# Patient Record
Sex: Female | Born: 1945 | Hispanic: Yes | Marital: Single | Smoking: Former smoker
Health system: Southern US, Community
[De-identification: ages and names within clinical notes are randomized; demographics above are authoritative.]

## PROBLEM LIST (undated history)

## (undated) DIAGNOSIS — E119 Type 2 diabetes mellitus without complications: Secondary | ICD-10-CM

## (undated) HISTORY — PX: BLADDER SURGERY: SHX569

---

## 2015-09-06 ENCOUNTER — Inpatient Hospital Stay (HOSPITAL_COMMUNITY)
Admission: EM | Admit: 2015-09-06 | Discharge: 2015-09-08 | DRG: 195 | Disposition: A | Discharge status: Home / Self Care | Payer: Medicaid Other | Attending: Student in an Organized Health Care Education/Training Program | Admitting: Student in an Organized Health Care Education/Training Program

## 2015-09-06 ENCOUNTER — Encounter (HOSPITAL_COMMUNITY): Payer: Self-pay | Admitting: Nurse Practitioner

## 2015-09-06 ENCOUNTER — Emergency Department (HOSPITAL_COMMUNITY): Payer: Medicaid Other

## 2015-09-06 DIAGNOSIS — R21 Rash and other nonspecific skin eruption: Secondary | ICD-10-CM | POA: Diagnosis present

## 2015-09-06 DIAGNOSIS — J13 Pneumonia due to Streptococcus pneumoniae: Principal | ICD-10-CM | POA: Diagnosis present

## 2015-09-06 DIAGNOSIS — J9601 Acute respiratory failure with hypoxia: Secondary | ICD-10-CM | POA: Insufficient documentation

## 2015-09-06 DIAGNOSIS — J189 Pneumonia, unspecified organism: Secondary | ICD-10-CM | POA: Diagnosis present

## 2015-09-06 DIAGNOSIS — E119 Type 2 diabetes mellitus without complications: Secondary | ICD-10-CM

## 2015-09-06 DIAGNOSIS — L299 Pruritus, unspecified: Secondary | ICD-10-CM | POA: Diagnosis present

## 2015-09-06 DIAGNOSIS — Z7984 Long term (current) use of oral hypoglycemic drugs: Secondary | ICD-10-CM

## 2015-09-06 DIAGNOSIS — R0902 Hypoxemia: Secondary | ICD-10-CM | POA: Diagnosis present

## 2015-09-06 DIAGNOSIS — Z87891 Personal history of nicotine dependence: Secondary | ICD-10-CM | POA: Diagnosis not present

## 2015-09-06 HISTORY — DX: Type 2 diabetes mellitus without complications: E11.9

## 2015-09-06 LAB — GLUCOSE, CAPILLARY
GLUCOSE-CAPILLARY: 44 mg/dL — AB (ref 65–99)
GLUCOSE-CAPILLARY: 30 mg/dL — AB (ref 65–99)
GLUCOSE-CAPILLARY: 101 mg/dL — AB (ref 65–99)
Glucose-Capillary: 112 mg/dL — ABNORMAL HIGH (ref 65–99)

## 2015-09-06 LAB — COMPREHENSIVE METABOLIC PANEL
ALBUMIN: 2.7 g/dL — AB (ref 3.5–5.0)
ALK PHOS: 90 U/L (ref 38–126)
ALT: 12 U/L — ABNORMAL LOW (ref 14–54)
ANION GAP: 12 (ref 5–15)
AST: 19 U/L (ref 15–41)
BILIRUBIN TOTAL: 0.5 mg/dL (ref 0.3–1.2)
BUN: 14 mg/dL (ref 6–20)
CALCIUM: 8.6 mg/dL — AB (ref 8.9–10.3)
CO2: 23 mmol/L (ref 22–32)
Chloride: 100 mmol/L — ABNORMAL LOW (ref 101–111)
Creatinine, Ser: 0.54 mg/dL (ref 0.44–1.00)
GFR calc non Af Amer: >60 mL/min (ref >60)
Glucose, Bld: 175 mg/dL — ABNORMAL HIGH (ref 65–99)
POTASSIUM: 4.3 mmol/L (ref 3.5–5.1)
SODIUM: 135 mmol/L (ref 135–145)
TOTAL PROTEIN: 6.5 g/dL (ref 6.5–8.1)

## 2015-09-06 LAB — CBC
HEMATOCRIT: 36.5 % (ref 36.0–46.0)
HEMOGLOBIN: 12 g/dL (ref 12.0–15.0)
MCH: 26.1 pg (ref 26.0–34.0)
MCHC: 32.9 g/dL (ref 30.0–36.0)
MCV: 79.3 fL (ref 78.0–100.0)
Platelets: 268 10*3/uL (ref 150–400)
RBC: 4.6 MIL/uL (ref 3.87–5.11)
RDW: 17.3 % — AB (ref 11.5–15.5)
WBC: 11.4 10*3/uL — ABNORMAL HIGH (ref 4.0–10.5)

## 2015-09-06 LAB — LIPASE, BLOOD: Lipase: 35 U/L (ref 11–51)

## 2015-09-06 LAB — INFLUENZA PANEL BY PCR (TYPE A & B)
H1N1FLUPCR: NOT DETECTED
INFLAPCR: NEGATIVE
INFLBPCR: NEGATIVE

## 2015-09-06 LAB — I-STAT TROPONIN, ED: TROPONIN I, POC: 0 ng/mL (ref 0.00–0.08)

## 2015-09-06 LAB — STREP PNEUMONIAE URINARY ANTIGEN: Strep Pneumo Urinary Antigen: POSITIVE — AB

## 2015-09-06 MED ORDER — DEXTROSE 5 % IV SOLN
500.0000 mg | INTRAVENOUS | Status: DC
  Administered 2015-09-07 – 2015-09-08 (×2): 500 mg via INTRAVENOUS
  Filled 2015-09-06 (×2): qty 500

## 2015-09-06 MED ORDER — INSULIN ASPART 100 UNIT/ML ~~LOC~~ SOLN: 0.0000 [IU] | Freq: Every day | SUBCUTANEOUS | Status: DC

## 2015-09-06 MED ORDER — ENOXAPARIN SODIUM 40 MG/0.4ML ~~LOC~~ SOLN
40.0000 mg | SUBCUTANEOUS | Status: DC
  Administered 2015-09-06 – 2015-09-07 (×2): 40 mg via SUBCUTANEOUS
  Filled 2015-09-06 (×2): qty 0.4

## 2015-09-06 MED ORDER — TRIAMCINOLONE ACETONIDE 0.1 % EX CREA
1.0000 "application " | TOPICAL_CREAM | Freq: Three times a day (TID) | CUTANEOUS | Status: DC
  Administered 2015-09-06 – 2015-09-08 (×6): 1 via TOPICAL
  Filled 2015-09-06 (×3): qty 15

## 2015-09-06 MED ORDER — SODIUM CHLORIDE 0.9 % IV SOLN
INTRAVENOUS | Status: AC
  Administered 2015-09-06: 23:00:00 via INTRAVENOUS

## 2015-09-06 MED ORDER — DEXTROSE 5 % IV SOLN
500.0000 mg | Freq: Once | INTRAVENOUS | Status: AC
  Administered 2015-09-06: 500 mg via INTRAVENOUS
  Filled 2015-09-06: qty 500

## 2015-09-06 MED ORDER — DEXTROSE 5 % IV SOLN
1.0000 g | Freq: Once | INTRAVENOUS | Status: AC
  Administered 2015-09-06: 1 g via INTRAVENOUS
  Filled 2015-09-06: qty 10

## 2015-09-06 MED ORDER — SODIUM CHLORIDE 0.9 % IV BOLUS (SEPSIS)
1000.0000 mL | Freq: Once | INTRAVENOUS | Status: AC
Start: 2015-09-06 — End: 2015-09-06
  Administered 2015-09-06: 1000 mL via INTRAVENOUS

## 2015-09-06 MED ORDER — INSULIN ASPART 100 UNIT/ML ~~LOC~~ SOLN
0.0000 [IU] | Freq: Three times a day (TID) | SUBCUTANEOUS | Status: DC
  Administered 2015-09-07: 1 [IU] via SUBCUTANEOUS
  Administered 2015-09-07 (×2): 3 [IU] via SUBCUTANEOUS
  Administered 2015-09-08: 5 [IU] via SUBCUTANEOUS
  Administered 2015-09-08: 2 [IU] via SUBCUTANEOUS

## 2015-09-06 MED ORDER — MORPHINE SULFATE (PF) 4 MG/ML IV SOLN
4.0000 mg | Freq: Once | INTRAVENOUS | Status: AC
  Administered 2015-09-06: 4 mg via INTRAVENOUS
  Filled 2015-09-06: qty 1

## 2015-09-06 MED ORDER — DICLOFENAC SODIUM 1 % TD GEL
2.0000 g | Freq: Four times a day (QID) | TRANSDERMAL | Status: DC
  Administered 2015-09-06 – 2015-09-08 (×8): 2 g via TOPICAL
  Filled 2015-09-06: qty 100

## 2015-09-06 MED ORDER — ACETAMINOPHEN 325 MG PO TABS: 650.0000 mg | ORAL_TABLET | Freq: Four times a day (QID) | ORAL | Status: DC | PRN

## 2015-09-06 NOTE — Progress Notes (Signed)
Ashlee Rivera [id number] Admission Data: 09/06/2015 4:19 PM Attending Provider: Tyson Aliasuncan Thomas Vincent, MD  PCP:No PCP Per Patient Consults/ Treatment Team:    Ashlee Rivera is a 70 y.o. female patient admitted from ED awake, alert  & orientated  X 3 (Spanish speaking),  No Order, VSS - Blood pressure 129/55, pulse 73, temperature 98.3 F (36.8 C), temperature source Oral, resp. rate 23, weight 65.006 kg (143 lb 5 oz), SpO2 94 %., O2    2  L nasal cannular, no c/o shortness of breath, no c/o chest pain, no distress noted. Tele #    IV site WDL:  [facility]   Allergies:  No Known Allergies   Past Medical History  Diagnosis Date  . Diabetes mellitus without complication (HCC)       Pt orientation to unit, room and routine. Information packet given to patient/family and safety video watched.  Admission INP armband ID verified with patient/family, and in place. SR up x 2, fall risk assessment complete with Patient and family verbalizing understanding of risks associated with falls. Pt verbalizes an understanding of how to use the call bell and to call for help before getting out of bed.  Skin, clean-dry- intact without evidence of bruising, or skin tears.   Rash noted to lower back side, and lower extremities. Open to air at this time. Rash seems to be circular and scabbed over. Family unable to comment at this time of reason for rash.      Will cont to monitor and assist as needed.  Kern ReapBrumagin, Tiler Brandis L, RN 09/06/2015 4:19 PM

## 2015-09-06 NOTE — H&P (Signed)
Date: 09/06/2015               Patient Name:  Ashlee Rivera MRN: [id number]  DOB: [birth year] Age / Sex: 70 y.o., female   PCP: No Pcp Per Patient         Medical Service: Internal Medicine Teaching Service         Attending Physician: Dr. Azalia BilisKevin Campos, MD    First Contact: Dr. Reubin MilanBilly Izael Rivera Pager: [phone]  Second Contact: Dr. Gara Kroneriana Rivera Pager: [phone]       After Hours (After 5p/  First Contact Pager: [phone]  weekends / holidays): Second Contact Pager: [phone]   Chief Complaint: Cough, fevers  History of Present Illness: Mrs. Trecia RogersSadana is a 70yo Spanish-speaking F with T2DM who presents with fevers and a productive cough that started 5 days ago. She is here from [place] family. She started having subjective fevers and chills, followed by a cough productive of clear-white sputum 5 days ago. Her cough worsened and then 3 days ago she started noticing sharp, right upper back/posterior shoulder pain that worsens with inspiration and coughing. She denies headache, sore throat, rhinorrhea, nausea, vomiting, diarrhea, abdominal pain, chest pain, palpitations, wheezing, weakness, myalgias, or urinary symptoms. She does endorse an intermittent pruritic, painful rash over her right upper back and midline lower back that has been going on for 3 months, that comes and goes spontaneously, and she was told this was related to her diabetes. No one else in the house has any respiratory symptoms or rashes.   Meds: Current Facility-Administered Medications  Medication Dose Route Frequency Provider Last Rate Last Dose  . azithromycin (ZITHROMAX) 500 mg in dextrose 5 % 250 mL IVPB  500 mg Intravenous Once Ashlee BilisKevin Campos, MD      . cefTRIAXone (ROCEPHIN) 1 g in dextrose 5 % 50 mL IVPB  1 g Intravenous Once Ashlee BilisKevin Campos, MD       No current outpatient prescriptions on file.    Allergies: Allergies as of 09/06/2015  . (No Known Allergies)   Past Medical History  Diagnosis Date  . Diabetes  mellitus without complication [facility](HCC)    Past Surgical History  Procedure Laterality Date  . Bladder surgery     History reviewed. No pertinent family history. Social History   Social History  . Marital Status: Single    Spouse Name: N/A  . Number of Children: N/A  . Years of Education: N/A   Occupational History  . Not on file.   Social History Main Topics  . Smoking status: Former Games developermoker  . Smokeless tobacco: Not on file  . Alcohol Use: No  . Drug Use: No  . Sexual Activity: Not on file   Other Topics Concern  . Not on file   Social History Narrative  . No narrative on file   Review of Systems: Pertinent items noted in HPI and remainder of comprehensive ROS otherwise negative.  Physical Exam: Blood pressure 133/60, pulse 77, temperature 98.3 F (36.8 C), temperature source Oral, resp. rate 24, weight 143 lb 5 oz (65.006 kg), SpO2 97 %.   Gen: Well-appearing, alert and oriented to person, place, and time HEENT: Oropharynx clear without erythema or exudate.  Neck: No cervical LAD, no thyromegaly or nodules, no JVD noted. CV: Normal rate, regular rhythm, no rubs, or gallops. A 2/6 systolic murmur heard best at the 2nd R ICS that is non-radiating. Pulmonary: Normal effort, coarse crackles heard bilaterally, worse at the bases. No wheezes auscultated. Abdominal: Soft,  non-tender, non-distended, without rebound, guarding, or masses Extremities: Distal pulses 2+ in upper and lower extremities bilaterally, no tenderness, erythema or edema.  Neuro: CN II-XII grossly intact, no focal weakness or sensory deficits noted Skin: No atypical appearing moles. Diffuse rash as pictured below.    Lab results: Basic Metabolic Panel:  Recent Labs  16/10/96 1257  NA 135  K 4.3  CL 100*  CO2 23  GLUCOSE 175*  BUN 14  CREATININE 0.54  CALCIUM 8.6*   Liver Function Tests:  Recent Labs  09/06/15 1257  AST 19  ALT 12*  ALKPHOS 90  BILITOT 0.5  PROT 6.5  ALBUMIN 2.7*     Recent Labs  09/06/15 1257  LIPASE 35   CBC:  Recent Labs  09/06/15 1258  WBC 11.4*  HGB 12.0  HCT 36.5  MCV 79.3  PLT 268   Imaging results:  Dg Chest 2 View  09/06/2015  CLINICAL DATA:  Cough and fever for 5 days. Back pain beginning 2 days ago. EXAM: CHEST  2 VIEW COMPARISON:  None. FINDINGS: There is patchy bilateral airspace disease somewhat worse on the right. Heart size is normal. No pneumothorax or pleural effusion. No focal bony abnormality. IMPRESSION: Patchy bilateral airspace disease has an appearance most suggestive of multifocal pneumonia rather than edema. Electronically Signed   By: Ashlee Rivera M.D.   On: 09/06/2015 13:04   Assessment & Plan by Problem: 1. Multifocal pneumonia -  Symptoms most suggestive of a respiratory illness, and given bilateral involvement on CXR, most suggestive of a viral CAP. She does not exhibit other flu-like symptoms at this time. Mild leukocytosis here, stable on room air.  -S/p ceftriaxone IV in ED, will continue on azithromycin IV and evaluate whether patient needs repeat dose of ceftriaxone tomorrow -IVNS bolus x 1 now; then MIVF NS @ 100/hr x 12 hrs -Rapid flu -EKG -Repeat CBC in AM -Tylenol, voltaren PRN for shoulder and back pain  2. Rash - pruritic, intermittent, unclear etiology. Clearly not zoster, preceded infection so unlikely a viral exanthem related to infection. May be a contact dermatitis. -Triamcinolone ointment here  3. T2DM - on metformin at home -Hold metformin -Sensitive SSI  Dispo: Disposition is deferred at this time, awaiting improvement of current medical problems. Anticipated discharge in approximately 1-2 day(s).   The patient does not have a current PCP (No Pcp Per Patient) and does need an [facility] hospital follow-up appointment after discharge.  The patient does not have transportation limitations that hinder transportation to clinic appointments.  Signed: Darrick Huntsman, MD 09/06/2015, 2:33  PM

## 2015-09-06 NOTE — ED Notes (Signed)
Pt c/o 5 day history of fevers, cough, then began to have right upper back pain 2 days ago. Her pain increases with cough and movement. She reports some mild SOB also

## 2015-09-06 NOTE — ED Provider Notes (Signed)
CSN: [id number]     Arrival date & time 09/06/15  1133 History   First MD Initiated Contact with Patient 09/06/15 1217     Chief Complaint  Patient presents with  . Back Pain    [facility] interpreter phone line utilized  HPI Patient presents to the emergency department with complaints of right upper back pain as well as fever and chills over the past 5 days with increasing cough and some shortness of breath with exertion.  She [place] in [place] and recently came to [place] ago.  Her symptoms began 5-6 days ago.  She is a diabetic and compliant with her medications.  She is found to be hypoxic to 87% on arrival of emergency department was placed on oxygen.  No history of unilateral leg swelling.  No history DVT or pulmonary embolism.   Past Medical History  Diagnosis Date  . Diabetes mellitus without complication [facility](HCC)    Past Surgical History  Procedure Laterality Date  . Bladder surgery     History reviewed. No pertinent family history. Social History  Substance Use Topics  . Smoking status: Former Games developermoker  . Smokeless tobacco: None  . Alcohol Use: No   OB History    No data available     Review of Systems  All other systems reviewed and are negative.     Allergies  Review of patient's allergies indicates no known allergies.  Home Medications   Prior to Admission medications   Not on File   BP 133/60 mmHg  Pulse 77  Temp(Src) 98.3 F (36.8 C) (Oral)  Resp 24  Wt 143 lb 5 oz (65.006 kg)  SpO2 97% Physical Exam  Constitutional: She is oriented to person, place, and time. She appears well-developed and well-nourished. No distress.  HENT:  Head: Normocephalic and atraumatic.  Eyes: EOM are normal.  Neck: Normal range of motion.  Cardiovascular: Normal rate, regular rhythm and normal heart sounds.   Pulmonary/Chest: Effort normal and breath sounds normal.  Abdominal: Soft. She exhibits no distension. There is no tenderness.  Musculoskeletal: Normal  range of motion.  Neurological: She is alert and oriented to person, place, and time.  Skin: Skin is warm and dry.  Rash of right scapular and right shoulder region appears to be consistent with uncovered vesicles concerning for shingles.  No surrounding erythema or warmth  Psychiatric: She has a normal mood and affect. Judgment normal.  Nursing note and vitals reviewed.   ED Course  Procedures (including critical care time) Labs Review Labs Reviewed  CBC - Abnormal; Notable for the following:    WBC 11.4 (*)    RDW 17.3 (*)    All other components within normal limits  COMPREHENSIVE METABOLIC PANEL - Abnormal; Notable for the following:    Chloride 100 (*)    Glucose, Bld 175 (*)    Calcium 8.6 (*)    Albumin 2.7 (*)    ALT 12 (*)    All other components within normal limits  LIPASE, BLOOD  I-STAT TROPOININ, ED    Imaging Review Dg Chest 2 View  09/06/2015  CLINICAL DATA:  Cough and fever for 5 days. Back pain beginning 2 days ago. EXAM: CHEST  2 VIEW COMPARISON:  None. FINDINGS: There is patchy bilateral airspace disease somewhat worse on the right. Heart size is normal. No pneumothorax or pleural effusion. No focal bony abnormality. IMPRESSION: Patchy bilateral airspace disease has an appearance most suggestive of multifocal pneumonia rather than edema. Electronically  Signed   By: Drusilla Kanner M.D.   On: 09/06/2015 13:04   I have personally reviewed and evaluated these images and lab results as part of my medical decision-making.   EKG Interpretation None      MDM   Final diagnoses:  None    Rash may represent shingles.  This will continue to be worked up in the hospital.  She has multifocal pneumonia and this is likely cause of her fever chills and cough as well as her hypoxia.  Patient be admitted to the hospital for additional workup.  Rocephin and azithromycin now.    Azalia Bilis, MD 09/06/15 [phone]

## 2015-09-07 DIAGNOSIS — L299 Pruritus, unspecified: Secondary | ICD-10-CM | POA: Diagnosis present

## 2015-09-07 DIAGNOSIS — R21 Rash and other nonspecific skin eruption: Secondary | ICD-10-CM

## 2015-09-07 DIAGNOSIS — E119 Type 2 diabetes mellitus without complications: Secondary | ICD-10-CM | POA: Diagnosis present

## 2015-09-07 DIAGNOSIS — R0902 Hypoxemia: Secondary | ICD-10-CM | POA: Diagnosis present

## 2015-09-07 DIAGNOSIS — J13 Pneumonia due to Streptococcus pneumoniae: Principal | ICD-10-CM

## 2015-09-07 DIAGNOSIS — Z87891 Personal history of nicotine dependence: Secondary | ICD-10-CM | POA: Diagnosis not present

## 2015-09-07 LAB — CBC
HCT: 33.4 % — ABNORMAL LOW (ref 36.0–46.0)
Hemoglobin: 11 g/dL — ABNORMAL LOW (ref 12.0–15.0)
MCH: 26.4 pg (ref 26.0–34.0)
MCHC: 32.9 g/dL (ref 30.0–36.0)
MCV: 80.1 fL (ref 78.0–100.0)
PLATELETS: 241 10*3/uL (ref 150–400)
RBC: 4.17 MIL/uL (ref 3.87–5.11)
RDW: 17.4 % — AB (ref 11.5–15.5)
WBC: 7.1 10*3/uL (ref 4.0–10.5)

## 2015-09-07 LAB — GLUCOSE, CAPILLARY
GLUCOSE-CAPILLARY: 129 mg/dL — AB (ref 65–99)
GLUCOSE-CAPILLARY: 239 mg/dL — AB (ref 65–99)
Glucose-Capillary: 226 mg/dL — ABNORMAL HIGH (ref 65–99)
Glucose-Capillary: 174 mg/dL — ABNORMAL HIGH (ref 65–99)

## 2015-09-07 LAB — BASIC METABOLIC PANEL
Anion gap: 7 (ref 5–15)
BUN: 9 mg/dL (ref 6–20)
CALCIUM: 8.1 mg/dL — AB (ref 8.9–10.3)
CO2: 27 mmol/L (ref 22–32)
Chloride: 106 mmol/L (ref 101–111)
Creatinine, Ser: 0.44 mg/dL (ref 0.44–1.00)
GFR calc Af Amer: >60 mL/min (ref >60)
GLUCOSE: 152 mg/dL — AB (ref 65–99)
Potassium: 3.9 mmol/L (ref 3.5–5.1)
Sodium: 140 mmol/L (ref 135–145)

## 2015-09-07 MED ORDER — DEXTROSE 5 % IV SOLN
1.0000 g | INTRAVENOUS | Status: DC
  Administered 2015-09-07 – 2015-09-08 (×2): 1 g via INTRAVENOUS
  Filled 2015-09-07 (×2): qty 10

## 2015-09-07 NOTE — Progress Notes (Signed)
   Subjective: [name] had no acute events overnight. Her breathing and cough are improving this morning. She denies fevers, chills, chest pain, or other symptoms at this time.  Objective: Vital signs in last 24 hours: Filed Vitals:   09/06/15 1530 09/06/15 1622 09/06/15 2132 09/07/15 0505  BP: 129/55 136/58 116/44 137/54  Pulse: 73 79 75 93  Temp:  98.1 F (36.7 C) 98.1 F (36.7 C) 98.4 F (36.9 C)  TempSrc:  Oral Oral Oral  Resp: 23 24 20 20   Height:  5\' 3"  (1.6 m)    Weight:  145 lb (65.772 kg)    SpO2: 94% 95% 99% 97%     Gen: Well-appearing, alert and oriented to person, place, and time CV: Normal rate, regular rhythm, no rubs, or gallops. A 2/6 systolic murmur heard best at the 2nd R ICS that is non-radiating. Pulmonary: Normal effort, coarse crackles heard bilaterally at the bases. No wheezes auscultated. Abdominal: Soft, non-tender, non-distended, without rebound, guarding, or masses Extremities: Distal pulses 2+ in upper and lower extremities bilaterally, no tenderness, erythema or edema Skin: No atypical appearing moles. Rashes as previously described are unchanged from previous exams.  Lab Results: Basic Metabolic Panel:  Recent Labs Lab 09/06/15 1257 09/07/15 0611  NA 135 140  K 4.3 3.9  CL 100* 106  CO2 23 27  GLUCOSE 175* 152*  BUN 14 9  CREATININE 0.54 0.44  CALCIUM 8.6* 8.1*   CBC:  Recent Labs Lab 09/06/15 1258 09/07/15 0611  WBC 11.4* 7.1  HGB 12.0 11.0*  HCT 36.5 33.4*  MCV 79.3 80.1  PLT 268 241   Assessment/Plan: 1. Multifocal CAP due to Strep pneumo - Bilateral involvement on CXR, She does not exhibit other flu-like symptoms at this time. Mild leukocytosis here that has resolved. Strep pneumo UAg positive, influenza negative. -Continue azithro and rocephin IV; will consider switch to oral antibiotic tomorrow -IV NS @ 100/hr -Tylenol, voltaren PRN for shoulder and back pain  2. Rash - pruritic, intermittent, unclear etiology.  Clearly not zoster, preceded infection so unlikely a viral exanthem related to infection. May be a contact dermatitis. -Triamcinolone ointment   Dispo: Disposition is deferred at this time, awaiting improvement of current medical problems.  Anticipated discharge in approximately 1 day(s).   The patient does not have a current PCP (No Pcp Per Patient) and [facility] after discharge.  The patient does not have transportation limitations that hinder transportation to clinic appointments.    Darrick HuntsmanWilliam R Ailis Rigaud, MD 09/07/2015, 10:44 AM

## 2015-09-08 LAB — GLUCOSE, CAPILLARY
GLUCOSE-CAPILLARY: 153 mg/dL — AB (ref 65–99)
GLUCOSE-CAPILLARY: 270 mg/dL — AB (ref 65–99)
GLUCOSE-CAPILLARY: 259 mg/dL — AB (ref 65–99)

## 2015-09-08 MED ORDER — AMOXICILLIN 500 MG PO TABS: 1000.0000 mg | ORAL_TABLET | Freq: Three times a day (TID) | ORAL | Status: DC

## 2015-09-08 MED ORDER — AMOXICILLIN 500 MG PO TABS: 1000.0000 mg | ORAL_TABLET | Freq: Three times a day (TID) | ORAL | Status: AC

## 2015-09-08 MED ORDER — PHENOL 1.4 % MT LIQD: 1.0000 | OROMUCOSAL | Status: DC | PRN

## 2015-09-08 MED ORDER — TRIAMCINOLONE ACETONIDE 0.1 % EX CREA: 1.0000 "application " | TOPICAL_CREAM | Freq: Three times a day (TID) | CUTANEOUS | Status: DC

## 2015-09-08 NOTE — Progress Notes (Signed)
   Subjective: [name] had no acute events overnight. She had a bit of cough yesterday afternoon, which is improved again this morning. She still has some throat irritation. However, she denies other symptoms at this time and overall feels better today.  Objective: Vital signs in last 24 hours: Filed Vitals:   09/07/15 0505 09/07/15 1432 09/07/15 2108 09/08/15 0620  BP: 137/54 155/65 132/59 157/62  Pulse: 93 77 79 73  Temp: 98.4 F (36.9 C) 98.7 F (37.1 C) 97.9 F (36.6 C) 98.3 F (36.8 C)  TempSrc: Oral  Oral Oral  Resp: 20 16 18 16   Height:      Weight:      SpO2: 97% 99% 94% 94%     Gen: Well-appearing, alert and oriented to person, place, and time CV: Normal rate, regular rhythm, no rubs, or gallops. A 2/6 systolic murmur heard best at the 2nd R ICS that is non-radiating. Pulmonary: Normal effort, coarse crackles heard bilaterally at the bases, much less than previous exams. No wheezes auscultated. Abdominal: Soft, non-tender, non-distended, without rebound, guarding, or masses Extremities: Distal pulses 2+ in upper and lower extremities bilaterally, no tenderness, erythema or edema Skin: No atypical appearing moles. Rashes as previously described are improved from previous exams.  Lab Results: Basic Metabolic Panel:  Recent Labs Lab 09/06/15 1257 09/07/15 0611  NA 135 140  K 4.3 3.9  CL 100* 106  CO2 23 27  GLUCOSE 175* 152*  BUN 14 9  CREATININE 0.54 0.44  CALCIUM 8.6* 8.1*   CBC:  Recent Labs Lab 09/06/15 1258 09/07/15 0611  WBC 11.4* 7.1  HGB 12.0 11.0*  HCT 36.5 33.4*  MCV 79.3 80.1  PLT 268 241   Assessment/Plan: 1. Multifocal CAP due to Strep pneumo - Bilateral involvement on CXR, She does not exhibit other flu-like symptoms at this time. Mild leukocytosis here that has resolved. Strep pneumo UAg positive, influenza negative. -Continue azithro and rocephin IV; will consider switch to oral high-dose amox +/- azithro (if less than 1.5g) -IV NS  @ 100/hr -Tylenol, voltaren PRN for shoulder and back pain -Will ambulate and check O2 sats, may be able to discharge thereafter.  2. Rash - pruritic, intermittent, unclear etiology. Clearly not zoster, preceded infection so unlikely a viral exanthem related to infection. May be a contact dermatitis. -Triamcinolone ointment   Dispo: Disposition is deferred at this time, awaiting improvement of current medical problems.  Anticipated discharge in approximately 1 day(s).   The patient does not have a current PCP (No Pcp Per Patient) and [facility] after discharge.  The patient does not have transportation limitations that hinder transportation to clinic appointments.  LOS: 1 day   Darrick HuntsmanWilliam R Carry Ortez, MD 09/08/2015, 7:40 AM

## 2015-09-08 NOTE — Discharge Instructions (Signed)
SraElmer Ramp. Sedana,  Estoy feliz de que usted sienta mejor.  Por favor tome el antibitico amoxicilina a partir de maana segn lo prescrito, 2 pldoras tres veces al da, maana, almuerzo y noche (6 pldoras por da), [employer] el [employer] [place].  Por favor aplique el ungento (triamcinolone) en las erupciones en su espalda.  Reubin MilanBilly Mckaylin Bastien, MD

## 2015-09-08 NOTE — Care Management Note (Signed)
Case Management Note  Patient Details  Name: Ashlee Rivera MRN: [id number] Date of Birth: March 08, 1946  Subjective/Objective:                 [facility] [place], brochure on front of chartwith appointment written on it, explained to [facility] bedside RN to explain to patient date of appointment and that she can fill Rx there at discharge when she has interpeter present and reviews DC instructions.    Action/Plan:  DC to home with family, [facility] apt made. Expected Discharge Date:                  Expected Discharge Plan:  Home/Self Care  In-House Referral:     Discharge planning Services  CM Consult, Indigent Health Clinic  Post Acute Care Choice:  NA Choice offered to:     DME Arranged:    DME Agency:     HH Arranged:    HH Agency:     Status of Service:  Completed, signed off  Medicare Important Message Given:    Date Medicare IM Given:    Medicare IM give by:    Date Additional Medicare IM Given:    Additional Medicare Important Message give by:     If discussed at Long Length of Stay Meetings, dates discussed:    Additional Comments:  Lawerance SabalDebbie Omarie Parcell, RN 09/08/2015, 2:28 PM

## 2015-09-08 NOTE — Progress Notes (Signed)
Patient was discharged home by MD order; discharged instructions review and give to patient and her granddaughter (she is speaking [place]) with care notes; IV DIC; patient will be escorted to the car by nurse tech via wheelchair.

## 2015-09-08 NOTE — Progress Notes (Signed)
SATURATION QUALIFICATIONS: (This note is used to comply with regulatory documentation for home oxygen)  Patient Saturations on Room Air at Rest = 98%  Patient Saturations on Room Air while Ambulating = 98%  Patient Saturations on 2 Liters of oxygen while Ambulating = 100%  Please briefly explain why patient needs home oxygen: Patient doesn't need oxygen

## 2015-09-09 LAB — LEGIONELLA PNEUMOPHILA SEROGP 1 UR AG: L. pneumophila Serogp 1 Ur Ag: NEGATIVE

## 2015-09-09 NOTE — Discharge Summary (Signed)
Name: Ashlee AcresJuana Rivera MRN: [id number] DOB: [birth year] 70 y.o. PCP: No Pcp Per Patient  Date of Admission: 09/06/2015 12:10 PM Date of Discharge: 09/09/2015 Attending Physician: No att. providers found  Discharge Diagnosis: 1. Pneumococcal community-acquired pneumonia  Active Problems:   CAP (community acquired pneumonia)   Hypoxia   Rash  Discharge Medications:   Medication List    TAKE these medications        amoxicillin 500 MG tablet  Commonly known as:  AMOXIL  Take 2 tablets (1,000 mg total) by mouth 3 (three) times daily.     ASPIR-LOW PO  Take 150 mg by mouth daily.     glyBURIDE 5 MG tablet  Commonly known as:  DIABETA  Take 5 mg by mouth 2 (two) times daily with a meal.     metFORMIN 850 MG tablet  Commonly known as:  GLUCOPHAGE  Take 850 mg by mouth 2 (two) times daily with a meal. ( Metformina 850 mg ) - pt gets meds from [place]     NIFEdipine 30 MG 24 hr tablet  Commonly known as:  PROCARDIA-XL/ADALAT CC  Take 30 mg by mouth daily. ( Nifedipino ) gets from [place]     triamcinolone cream 0.1 %  Commonly known as:  KENALOG  Apply 1 application topically 3 (three) times daily.        Disposition and follow-up:   Ashlee Rivera [facility] Good condition.  At the hospital follow up visit please address:  1.  No recurrence of her respiratory symptoms? Improvement in her rash with triamcinolone?  2.  Labs / imaging needed at time of follow-up: None  3.  Pending labs/ test needing follow-up: None  Follow-up Appointments:     Follow-up Information    Follow up with [facility]. Go on 09/11/2015.   Why:  2;45 pm   Contact information:   [address] [facility] [place] [phone]      Discharge Instructions: Discharge Instructions    Diet - low sodium heart healthy    Complete by:  As directed      Increase activity slowly    Complete by:  As  directed            Consultations:    Procedures Performed:  Dg Chest 2 View  09/06/2015  CLINICAL DATA:  Cough and fever for 5 days. Back pain beginning 2 days ago. EXAM: CHEST  2 VIEW COMPARISON:  None. FINDINGS: There is patchy bilateral airspace disease somewhat worse on the right. Heart size is normal. No pneumothorax or pleural effusion. No focal bony abnormality. IMPRESSION: Patchy bilateral airspace disease has an appearance most suggestive of multifocal pneumonia rather than edema. Electronically Signed   By: Drusilla Kannerhomas  Dalessio M.D.   On: 09/06/2015 13:04    2D Echo: None  Cardiac Cath: None  Admission HPI: Ashlee Rivera is a 70yo Spanish-speaking F with T2DM who presents with fevers and a productive cough that started 5 days ago. She is here from [place] visiting family. She started having subjective fevers and chills, followed by a cough productive of clear-white sputum 5 days ago. Her cough worsened and then 3 days ago she started noticing sharp, right upper back/posterior shoulder pain that worsens with inspiration and coughing. She denies headache, sore throat, rhinorrhea, nausea, vomiting, diarrhea, abdominal pain, chest pain, palpitations, wheezing, weakness, myalgias, or urinary symptoms. She does endorse an intermittent pruritic, painful rash over  her right upper back and midline lower back that has been going on for 3 months, that comes and goes spontaneously, and she was told this was related to her diabetes. No one else in the house has any respiratory symptoms or rashes.    Hospital Course by problem list: Active Problems:   CAP (community acquired pneumonia)   Hypoxia   Rash   1. Pneumococcal community-acquired pneumonia - given her symptoms, a CXR showed bilateral multifocal pneumonia. She was started on IV azithromycin and ceftrioaxone. She remained afebrile throughout the admission and did not exhibit any other flu-like symptoms. Her initial leukocytosis resolved, and a  Strep pneumo urine antigen was positive. Influenza PCR was negative. She was transitioned to high-dose amoxicillin 1g PO TID to complete a 7 day total course of antibiotics for CAP.  2. Pruritic papular rash - also had a history of pruritic papular rash over her lower back that had been intermittent for years. We prescribed her a triamcinolone ointment to apply to the rash which helped the rash.  Discharge Vitals:   BP 157/62 mmHg  Pulse 73  Temp(Src) 98.3 F (36.8 C) (Oral)  Resp 16  Ht  (1.6 m)  Wt 145 lb (65.772 kg)  BMI 25.69 kg/m2  SpO2 94%  Discharge Labs:  Results for orders placed or performed during the hospital encounter of 09/06/15 (from the past 24 hour(s))  Glucose, capillary     Status: Abnormal   Collection Time: 09/08/15 12:21 PM  Result Value Ref Range   Glucose-Capillary 270 (H) 65 - 99 mg/dL  Glucose, capillary     Status: Abnormal   Collection Time: 09/08/15  5:14 PM  Result Value Ref Range   Glucose-Capillary 259 (H) 65 - 99 mg/dL    Signed: Darrick Huntsman, MD 09/09/2015, 8:28 AM    Services Ordered on Discharge: None Equipment Ordered on Discharge: None

## 2015-09-11 ENCOUNTER — Ambulatory Visit: Payer: Self-pay | Attending: Internal Medicine | Admitting: Physician Assistant

## 2015-09-11 ENCOUNTER — Encounter: Payer: Self-pay | Admitting: Physician Assistant

## 2015-09-11 VITALS — BP 146/69 | HR 75 | Temp 98.1°F | Resp 18 | Ht 63.0 in | Wt 144.0 lb

## 2015-09-11 DIAGNOSIS — Z9889 Other specified postprocedural states: Secondary | ICD-10-CM | POA: Insufficient documentation

## 2015-09-11 DIAGNOSIS — Z7982 Long term (current) use of aspirin: Secondary | ICD-10-CM | POA: Insufficient documentation

## 2015-09-11 DIAGNOSIS — R21 Rash and other nonspecific skin eruption: Secondary | ICD-10-CM | POA: Insufficient documentation

## 2015-09-11 DIAGNOSIS — J189 Pneumonia, unspecified organism: Secondary | ICD-10-CM | POA: Insufficient documentation

## 2015-09-11 DIAGNOSIS — Z7984 Long term (current) use of oral hypoglycemic drugs: Secondary | ICD-10-CM | POA: Insufficient documentation

## 2015-09-11 DIAGNOSIS — J154 Pneumonia due to other streptococci: Secondary | ICD-10-CM

## 2015-09-11 DIAGNOSIS — E119 Type 2 diabetes mellitus without complications: Secondary | ICD-10-CM

## 2015-09-11 DIAGNOSIS — I10 Essential (primary) hypertension: Secondary | ICD-10-CM

## 2015-09-11 LAB — GLUCOSE, POCT (MANUAL RESULT ENTRY): POC Glucose: 238 mg/dl — AB (ref 70–99)

## 2015-09-11 LAB — POCT GLYCOSYLATED HEMOGLOBIN (HGB A1C): HEMOGLOBIN A1C: 8.8

## 2015-09-11 NOTE — Progress Notes (Signed)
Ashlee Rivera  ZOX:[id number]  [id number]  DOB - 1945-07-11  Chief Complaint  Patient presents with  . Hospitalization Follow-up    Pneumonia       Subjective:   Ashlee Rivera is a 70 y.o. female here today for establishment of care. She has a history of diabetes mellitus type 2 and hypertension. Most of her care is in [place]. She is usually in [place] for approximately 2 years and comes here for 2-3 months. She presented to the hospital on 09/06/2015 with complaints of fever and cough for 5 days prior. She returned from [place] in 08/28/2015. She was afebrile on presentation but hypoxic. Her chest x-ray was consistent with a right lower lobe pneumonia. A flu test was negative. Her strep pneumonia urine antigen was positive. Her white blood cell count was 11,000. She was admitted with pneumonia. She also had a rash along the right upper back and lower left side. Her left thigh as well. This has been going on for months. She was tested for HIV and this was negative. She was treated with ceftriaxone and azithromycin initially. This was later changed to amoxicillin. She was started on triamcinolone cream for the rash.  She is a diabetic and checks her blood sugars it regularly but does check them nevertheless. Her range is usually from 100-145. Her number was 238 when we checked it today. Hemoglobin A 1C was 8.8%. She has a doctor in [place] that takes care of her diabetes prescriptions. No blurred vision, frequent urination, frequent feeling of thirst, headaches or lightheadedness.  ROS: GEN: denies fever or chills, denies change in weight Skin: denies lesions or + rashes HEENT: denies headache, earache, epistaxis, sore throat, or neck pain LUNGS: denies SHOB, dyspnea, PND, orthopnea CV: denies CP or palpitations ABD: denies abd pain, N or V EXT: denies muscle spasms or swelling; no pain in lower ext, no weakness NEURO: denies numbness or tingling, denies sz, stroke or  TIA  ALLERGIES: No Known Allergies  PAST MEDICAL HISTORY: Past Medical History  Diagnosis Date  . Diabetes mellitus without complication (HCC)     PAST SURGICAL HISTORY: Past Surgical History  Procedure Laterality Date  . Bladder surgery      MEDICATIONS AT HOME: Prior to Admission medications   Medication Sig Start Date End Date Taking? Authorizing Provider  amoxicillin (AMOXIL) 500 MG tablet Take 2 tablets (1,000 mg total) by mouth 3 (three) times daily. 09/09/15 09/12/15 Yes Darrick Huntsman, MD  Aspirin (ASPIR-LOW PO) Take 150 mg by mouth daily.   Yes Historical Provider, MD  glyBURIDE (DIABETA) 5 MG tablet Take 5 mg by mouth 2 (two) times daily with a meal.   Yes Historical Provider, MD  metFORMIN (GLUCOPHAGE) 850 MG tablet Take 850 mg by mouth 2 (two) times daily with a meal. ( Metformina 850 mg ) - pt gets meds from [place]   Yes Historical Provider, MD  NIFEdipine (PROCARDIA-XL/ADALAT CC) 30 MG 24 hr tablet Take 30 mg by mouth daily. ( Nifedipino ) gets from [place]   Yes Historical Provider, MD  triamcinolone cream (KENALOG) 0.1 % Apply 1 application topically 3 (three) times daily. 09/08/15  Yes Darrick Huntsman, MD     Objective:   Filed Vitals:   09/11/15 1539  BP: 146/69  Pulse: 75  Temp: 98.1 F (36.7 C)  TempSrc: Oral  Resp: 18  Height:  (1.6 m)  Weight: 144 lb (65.318 kg)  SpO2: 96%    Exam General appearance : Awake,  alert, not in any distress. Speech Clear. Not toxic looking Neck: supple, no JVD. No cervical lymphadenopathy.  Chest:Good air entry bilaterally, no added sounds  CVS: S1 S2 regular, no murmurs.  Extremities: B/L Lower Ext shows no edema, both legs are warm to touch Neurology: Awake alert, and oriented X 3, CN II-XII intact, Non focal Skin:+ papular rash, ecchymotic and erythematous on right torso, left upper back and left outter thigh   Data Review Lab Results  Component Value Date   HGBA1C 8.8 09/11/2015     Assessment &  Plan  1. Strep PNA  -Complete ABX  2. Rash   -Cont Triamcinolone cream   -Derm referral if no improvement 3. DM2  -Aim for 30 minutes of exercise most days. Rethink what you drink. Water is great! Aim for 2-3 Carb Choices per meal (30-45 grams) +/- 1 either way  Aim for 0-15 Carbs per snack if hungry  Include protein in moderation with your meals and snacks  Consider reading food labels for Total Carbohydrate and Fat Grams of foods  Consider checking BG at alternate times per day  Continue taking medication as directed Be mindful about how much sugar you are adding to beverages and other foods. Fruit Punch - find one with no sugar  Measure and decrease portions of carbohydrate foods  Make your plate and don't go back for seconds  -keep a log of fingersicks and bring to next appt  -Cont Metformin for now 4. HTN-controlled  -Cont current meds  -DASH diet  Return in about 4 weeks (around 10/09/2015). for routine health maintenance. She will return to [place] in July.   The patient was given clear instructions to go to ER or return to medical center if symptoms don't improve, worsen or new problems develop. The patient verbalized understanding. The patient was told to call to get lab results if they haven't heard anything in the next week.   This note has been created with Education officer, environmentalDragon speech recognition software and smart phrase technology. Any transcriptional errors are unintentional.    Scot Juniffany Faithe Ariola, PA-C [facility] [place], [place] [phone]   09/11/2015, 3:43 PM

## 2015-09-11 NOTE — Progress Notes (Signed)
Patient is here for HFU for Pneumonia  Patient denies pain at this time.  Patient states she has taken her medications and patient has eaten today.  Patient declined the flu shot today.

## 2015-10-06 ENCOUNTER — Ambulatory Visit: Payer: Self-pay | Attending: Family Medicine | Admitting: Family Medicine

## 2015-10-06 ENCOUNTER — Encounter: Payer: Self-pay | Admitting: Family Medicine

## 2015-10-06 VITALS — BP 134/74 | HR 73 | Temp 98.1°F | Resp 16 | Ht 62.0 in | Wt 144.2 lb

## 2015-10-06 DIAGNOSIS — Z79899 Other long term (current) drug therapy: Secondary | ICD-10-CM | POA: Insufficient documentation

## 2015-10-06 DIAGNOSIS — I10 Essential (primary) hypertension: Secondary | ICD-10-CM | POA: Insufficient documentation

## 2015-10-06 DIAGNOSIS — E119 Type 2 diabetes mellitus without complications: Secondary | ICD-10-CM | POA: Insufficient documentation

## 2015-10-06 DIAGNOSIS — Z7984 Long term (current) use of oral hypoglycemic drugs: Secondary | ICD-10-CM | POA: Insufficient documentation

## 2015-10-06 DIAGNOSIS — R21 Rash and other nonspecific skin eruption: Secondary | ICD-10-CM | POA: Insufficient documentation

## 2015-10-06 LAB — GLUCOSE, POCT (MANUAL RESULT ENTRY): POC Glucose: 136 mg/dl — AB (ref 70–99)

## 2015-10-06 MED ORDER — PNEUMOCOCCAL VAC POLYVALENT 25 MCG/0.5ML IJ INJ: 0.5000 mL | INJECTION | INTRAMUSCULAR | Status: DC

## 2015-10-06 MED ORDER — GLYBURIDE 5 MG PO TABS
5.0000 mg | ORAL_TABLET | Freq: Two times a day (BID) | ORAL | Status: AC
Start: 2015-10-06 — End: ?

## 2015-10-06 MED ORDER — TRIAMCINOLONE ACETONIDE 0.1 % EX CREA: 1.0000 "application " | TOPICAL_CREAM | Freq: Three times a day (TID) | CUTANEOUS | Status: AC

## 2015-10-06 MED ORDER — PNEUMOCOCCAL VAC POLYVALENT 25 MCG/0.5ML IJ INJ: 0.5000 mL | INJECTION | Freq: Once | INTRAMUSCULAR | Status: DC

## 2015-10-06 MED ORDER — METFORMIN HCL 850 MG PO TABS: 850.0000 mg | ORAL_TABLET | Freq: Two times a day (BID) | ORAL | Status: AC

## 2015-10-06 MED ORDER — LISINOPRIL 2.5 MG PO TABS: 10.0000 mg | ORAL_TABLET | Freq: Every day | ORAL | Status: DC

## 2015-10-06 NOTE — Progress Notes (Signed)
Subjective:  Patient ID: Ashlee Rivera, female    DOB: 05/11/1946  Age: 70 y.o. MRN: [id number]  CC: Establish Care   HPI Oluwatobi Ruppe is a  70 year old female with a history of type 2 diabetes mellitus (A1c 8.8), hypertension who comes into the clinic for follow-up visit. She has a rash on her thighs for which she had been using Kenalog cream with improvement in symptoms and is requesting a refill.  She will be going back to [place] in August of this year but needs some refills until then; her visit with an ophthalmologist was last year. Seen with the aid of a Spanish-speaking video interpreter and she has no additional concerns.  Outpatient Prescriptions Prior to Visit  Medication Sig Dispense Refill  . Aspirin (ASPIR-LOW PO) Take 150 mg by mouth daily.    Marland Kitchen NIFEdipine (PROCARDIA-XL/ADALAT CC) 30 MG 24 hr tablet Take 30 mg by mouth daily. ( Nifedipino ) gets from [place]    . glyBURIDE (DIABETA) 5 MG tablet Take 5 mg by mouth 2 (two) times daily with a meal.    . metFORMIN (GLUCOPHAGE) 850 MG tablet Take 850 mg by mouth 2 (two) times daily with a meal. ( Metformina 850 mg ) - pt gets meds from [place]    . triamcinolone cream (KENALOG) 0.1 % Apply 1 application topically 3 (three) times daily. 30 g 0   No facility-administered medications prior to visit.    ROS Review of Systems  Objective:  BP 134/74 mmHg  Pulse 73  Temp(Src) 98.1 F (36.7 C) (Oral)  Resp 16  Ht  (1.575 m)  Wt 144 lb 3.2 oz (65.409 kg)  BMI 26.37 kg/m2  SpO2 99%  BP/Weight 10/06/2015 09/11/2015 09/08/2015  Systolic BP 134 146 157  Diastolic BP 74 69 62  Wt. (Lbs) 144.2 144 -  BMI 26.37 25.51 -      Physical Exam  Constitutional: She is oriented to person, place, and time. She appears well-developed and well-nourished.  Cardiovascular: Normal rate, normal heart sounds and intact distal pulses.   No murmur heard. Pulmonary/Chest: Effort normal and breath sounds normal. She has no wheezes. She has  no rales. She exhibits no tenderness.  Abdominal: Soft. Bowel sounds are normal. She exhibits no distension and no mass. There is no tenderness.  Musculoskeletal: Normal range of motion.  Neurological: She is alert and oriented to person, place, and time.  Skin: Rash noted.    Lab Results  Component Value Date   HGBA1C 8.8 09/11/2015    CMP Latest Ref Rng 09/07/2015 09/06/2015  Glucose 65 - 99 mg/dL 782(N) 562(Z)  BUN 6 - 20 mg/dL 9 14  Creatinine 3.08 - 1.00 mg/dL 6.57 8.46  Sodium 962 - 145 mmol/L 140 135  Potassium 3.5 - 5.1 mmol/L 3.9 4.3  Chloride 101 - 111 mmol/L 106 100(L)  CO2 22 - 32 mmol/L 27 23  Calcium 8.9 - 10.3 mg/dL 8.1(L) 8.6(L)  Total Protein 6.5 - 8.1 g/dL - 6.5  Total Bilirubin 0.3 - 1.2 mg/dL - 0.5  Alkaline Phos 38 - 126 U/L - 90  AST 15 - 41 U/L - 19  ALT 14 - 54 U/L - 12(L)     Assessment & Plan:   1. Type 2 diabetes mellitus without complication, without long-term current use of insulin (HCC) Uncontrolled with A1c of 8.8, CBG is 136 today. Blood sugar log reveals improvement and so I will hold off on making regimen changes. Foot exam, Pneumovax, and ACE  inhibitor commence today; advised to schedule annual eye exam with an optometrist or ophthalmologist. - Glucose (CBG) - Lipid panel; Future - Microalbumin, urine; Future - glyBURIDE (DIABETA) 5 MG tablet; Take 1 tablet (5 mg total) by mouth 2 (two) times daily with a meal.  Dispense: 60 tablet; Refill: 2 - metFORMIN (GLUCOPHAGE) 850 MG tablet; Take 1 tablet (850 mg total) by mouth 2 (two) times daily with a meal. ( Metformina 850 mg ) - pt gets meds from [place]  Dispense: 60 tablet; Refill: 2 - pneumococcal 23 valent vaccine (PNU-IMMUNE) injection 0.5 mL; Inject 0.5 mLs into the muscle once.  2. Rash Unknown etiology Symptoms seem to improve with Kenalog - triamcinolone cream (KENALOG) 0.1 %; Apply 1 application topically 3 (three) times daily.  Dispense: 30 g; Refill: 2  3. Essential  hypertension Controlled - lisinopril (PRINIVIL,ZESTRIL) 2.5 MG tablet; Take 4 tablets (10 mg total) by mouth daily.  Dispense: 30 tablet; Refill: 2   Meds ordered this encounter  Medications  . triamcinolone cream (KENALOG) 0.1 %    Sig: Apply 1 application topically 3 (three) times daily.    Dispense:  30 g    Refill:  2  . glyBURIDE (DIABETA) 5 MG tablet    Sig: Take 1 tablet (5 mg total) by mouth 2 (two) times daily with a meal.    Dispense:  60 tablet    Refill:  2  . metFORMIN (GLUCOPHAGE) 850 MG tablet    Sig: Take 1 tablet (850 mg total) by mouth 2 (two) times daily with a meal. ( Metformina 850 mg ) - pt gets meds from [place]    Dispense:  60 tablet    Refill:  2  . lisinopril (PRINIVIL,ZESTRIL) 2.5 MG tablet    Sig: Take 4 tablets (10 mg total) by mouth daily.    Dispense:  30 tablet    Refill:  2  . pneumococcal 23 valent vaccine (PNU-IMMUNE) injection 0.5 mL    Sig:     Follow-up: Return in about 3 months (around 01/05/2016) for Follow-up on diabetes.   Jaclyn ShaggyEnobong Amao MD

## 2015-10-06 NOTE — Patient Instructions (Signed)
Diabetes Mellitus and Food It is important for you to manage your blood sugar (glucose) level. Your blood glucose level can be greatly affected by what you eat. Eating healthier foods in the appropriate amounts throughout the day at about the same time each day will help you control your blood glucose level. It can also help slow or prevent worsening of your diabetes mellitus. Healthy eating may even help you improve the level of your blood pressure and reach or maintain a healthy weight.  General recommendations for healthful eating and cooking habits include:  Eating meals and snacks regularly. Avoid going long periods of time without eating to lose weight.  Eating a diet that consists mainly of plant-based foods, such as fruits, vegetables, nuts, legumes, and whole grains.  Using low-heat cooking methods, such as baking, instead of high-heat cooking methods, such as deep frying. Work with your dietitian to make sure you understand how to use the Nutrition Facts information on food labels. HOW CAN FOOD AFFECT ME? Carbohydrates Carbohydrates affect your blood glucose level more than any other type of food. Your dietitian will help you determine how many carbohydrates to eat at each meal and teach you how to count carbohydrates. Counting carbohydrates is important to keep your blood glucose at a healthy level, especially if you are using insulin or taking certain medicines for diabetes mellitus. Alcohol Alcohol can cause sudden decreases in blood glucose (hypoglycemia), especially if you use insulin or take certain medicines for diabetes mellitus. Hypoglycemia can be a life-threatening condition. Symptoms of hypoglycemia (sleepiness, dizziness, and disorientation) are similar to symptoms of having too much alcohol.  If your health care provider has given you approval to drink alcohol, do so in moderation and use the following guidelines:  Women should not have more than one drink per day, and men  should not have more than two drinks per day. One drink is equal to:  12 oz of beer.  5 oz of wine.  1 oz of hard liquor.  Do not drink on an empty stomach.  Keep yourself hydrated. Have water, diet soda, or unsweetened iced tea.  Regular soda, juice, and other mixers might contain a lot of carbohydrates and should be counted. WHAT FOODS ARE NOT RECOMMENDED? As you make food choices, it is important to remember that all foods are not the same. Some foods have fewer nutrients per serving than other foods, even though they might have the same number of calories or carbohydrates. It is difficult to get your body what it needs when you eat foods with fewer nutrients. Examples of foods that you should avoid that are high in calories and carbohydrates but low in nutrients include:  Trans fats (most processed foods list trans fats on the Nutrition Facts label).  Regular soda.  Juice.  Candy.  Sweets, such as cake, pie, doughnuts, and cookies.  Fried foods. WHAT FOODS CAN I EAT? Eat nutrient-rich foods, which will nourish your body and keep you healthy. The food you should eat also will depend on several factors, including:  The calories you need.  The medicines you take.  Your weight.  Your blood glucose level.  Your blood pressure level.  Your cholesterol level. You should eat a variety of foods, including:  Protein.  Lean cuts of meat.  Proteins low in saturated fats, such as fish, egg whites, and beans. Avoid processed meats.  Fruits and vegetables.  Fruits and vegetables that may help control blood glucose levels, such as apples, mangoes, and   yams.  Dairy products.  Choose fat-free or low-fat dairy products, such as milk, yogurt, and cheese.  Grains, bread, pasta, and rice.  Choose whole grain products, such as multigrain bread, whole oats, and brown rice. These foods may help control blood pressure.  Fats.  Foods containing healthful fats, such as nuts,  avocado, olive oil, canola oil, and fish. DOES EVERYONE WITH DIABETES MELLITUS HAVE THE SAME MEAL PLAN? Because every person with diabetes mellitus is different, there is not one meal plan that works for everyone. It is very important that you meet with a dietitian who will help you create a meal plan that is just right for you.   This information is not intended to replace advice given to you by your health care provider. Make sure you discuss any questions you have with your health care provider.   Document Released: 02/25/2005 Document Revised: 06/21/2014 Document Reviewed: 04/27/2013 Elsevier Interactive Patient Education 2016 Elsevier Inc.  

## 2015-10-06 NOTE — Progress Notes (Signed)
Patient's here to establish care with PCP.  Patient requesting med refill of kenalog cream.

## 2015-10-09 ENCOUNTER — Ambulatory Visit: Payer: Self-pay | Attending: Family Medicine

## 2015-10-09 DIAGNOSIS — Z0000 Encounter for general adult medical examination without abnormal findings: Secondary | ICD-10-CM

## 2015-10-09 DIAGNOSIS — Z23 Encounter for immunization: Secondary | ICD-10-CM | POA: Insufficient documentation

## 2015-10-09 DIAGNOSIS — E119 Type 2 diabetes mellitus without complications: Secondary | ICD-10-CM | POA: Insufficient documentation

## 2015-10-09 LAB — LIPID PANEL
CHOL/HDL RATIO: 3.8 ratio (ref <=5.0)
Cholesterol: 167 mg/dL (ref 125–200)
HDL: 44 mg/dL — AB (ref >=46)
LDL Cholesterol: 100 mg/dL (ref <130)
Triglycerides: 117 mg/dL (ref <150)
VLDL: 23 mg/dL (ref <30)

## 2015-10-09 MED ORDER — PNEUMOCOCCAL VAC POLYVALENT 25 MCG/0.5ML IJ INJ: 0.5000 mL | INJECTION | Freq: Once | INTRAMUSCULAR | Status: AC

## 2015-10-09 NOTE — Progress Notes (Signed)
Pt is for lab work and immunization only.

## 2015-10-09 NOTE — Addendum Note (Signed)
Addended by: Benjamin StainBENNETT-CURSE, Tiwana Chavis L on: 10/09/2015 12:50 PM   Modules accepted: Orders

## 2015-10-09 NOTE — Patient Instructions (Addendum)
Patient was informed that once labs results are received, patient will be contacted.

## 2015-10-09 NOTE — Progress Notes (Incomplete)
Patient tolerated injection well. Patient was given pneumovax 23 IM

## 2015-10-10 LAB — MICROALBUMIN, URINE: MICROALB UR: 1.3 mg/dL

## 2015-10-13 ENCOUNTER — Other Ambulatory Visit: Payer: Self-pay | Admitting: Family Medicine

## 2015-10-13 ENCOUNTER — Ambulatory Visit: Payer: Self-pay

## 2015-10-13 DIAGNOSIS — I10 Essential (primary) hypertension: Secondary | ICD-10-CM

## 2015-10-13 MED ORDER — LISINOPRIL 2.5 MG PO TABS: 2.5000 mg | ORAL_TABLET | Freq: Every day | ORAL | Status: DC

## 2015-10-14 ENCOUNTER — Telehealth: Payer: Self-pay

## 2015-10-14 NOTE — Telephone Encounter (Signed)
-----   Message from Jaclyn ShaggyEnobong Amao, MD sent at 10/10/2015  9:03 AM EDT ----- Please inform the patient that labs are normal. Thank you.

## 2015-10-15 NOTE — Telephone Encounter (Signed)
-----   Message from Enobong Amao, MD sent at 10/10/2015  9:03 AM EDT ----- Please inform the patient that labs are normal. Thank you. 

## 2015-10-15 NOTE — Telephone Encounter (Signed)
Spoke with interpreter Archie Pattenonya [phone] from [facility] interpreter. Patient wasn't available to take my call. Patient daughter answered. Per HIPPA, patient daughter wasn't given permission to receive her mother's results.

## 2015-10-16 NOTE — Telephone Encounter (Signed)
Letter will be sent to patient address on file to contact us.

## 2015-11-28 ENCOUNTER — Telehealth: Payer: Self-pay | Admitting: Family Medicine

## 2015-11-28 NOTE — Telephone Encounter (Signed)
Pt. Called stating that she had been receiving calls form the [facility].  Pt. Was informed that the reasons for the calls was to let her know that her  Labs were normal.

## 2016-05-03 ENCOUNTER — Ambulatory Visit (HOSPITAL_COMMUNITY)
Admission: EM | Admit: 2016-05-03 | Discharge: 2016-05-03 | Disposition: A | Discharge status: Home / Self Care | Payer: Self-pay | Attending: Emergency Medicine | Admitting: Emergency Medicine

## 2016-05-03 ENCOUNTER — Encounter (HOSPITAL_COMMUNITY): Payer: Self-pay | Admitting: Emergency Medicine

## 2016-05-03 ENCOUNTER — Ambulatory Visit (INDEPENDENT_AMBULATORY_CARE_PROVIDER_SITE_OTHER): Payer: Self-pay

## 2016-05-03 DIAGNOSIS — J069 Acute upper respiratory infection, unspecified: Secondary | ICD-10-CM

## 2016-05-03 DIAGNOSIS — B9789 Other viral agents as the cause of diseases classified elsewhere: Secondary | ICD-10-CM

## 2016-05-03 MED ORDER — IPRATROPIUM BROMIDE 0.06 % NA SOLN
2.0000 | Freq: Four times a day (QID) | NASAL | 0 refills | Status: AC
Start: 2016-05-03 — End: ?

## 2016-05-03 MED ORDER — BENZONATATE 200 MG PO CAPS: 200.0000 mg | ORAL_CAPSULE | Freq: Three times a day (TID) | ORAL | 0 refills | Status: AC | PRN

## 2016-05-03 NOTE — ED Provider Notes (Signed)
HPI  SUBJECTIVE:  Ashlee Rivera is a 70 y.o. female who presents with nasal congestion, rhinorrhea, burning sore throat, cough occasionally productive of whitish yellowish phlegm for the past 3 days. She reports body aches first day, but this has since resolved. Also reports fatigue. States that she cannot sleep secondary to the cough. She tried an over-the-counter cough medicine which did not help. There are no other aggravating or alleviating factors. She denies postnasal drip, burning chest pain, voice changes, sensation throat swelling shut, difficulty breathing, weakness, sinus pain or pressure. No fevers, chest pain, shortness of breath, wheezing. No antibiotics in the past month. No sick contacts with flu or cold symptoms. No antipyretic in the past 6-8 hours. No allergy symptoms. She did get a flu shot this year. She is a diabetic, states that her glucose has been running within normal limits for her. She is history of hypertension for which she takes lisinopril, community acquired pneumonia she is a former smoker. No history of GERD, allergies, asthma, emphysema, COPD. Pmd:  Jaclyn ShaggyEnobong, Amao, MD   Past Medical History:  Diagnosis Date  . Diabetes mellitus without complication [facility](HCC)     Past Surgical History:  Procedure Laterality Date  . BLADDER SURGERY      History reviewed. No pertinent family history.  Social History  Substance Use Topics  . Smoking status: Former Games developermoker  . Smokeless tobacco: Never Used  . Alcohol use No     Current Facility-Administered Medications:  .  pneumococcal 23 valent vaccine (PNU-IMMUNE) injection 0.5 mL, 0.5 mL, Intramuscular, Once, Jaclyn ShaggyEnobong Amao, MD  Current Outpatient Prescriptions:  .  Aspirin (ASPIR-LOW PO), Take 150 mg by mouth daily., Disp: , Rfl:  .  glyBURIDE (DIABETA) 5 MG tablet, Take 1 tablet (5 mg total) by mouth 2 (two) times daily with a meal., Disp: 60 tablet, Rfl: 2 .  lisinopril (PRINIVIL,ZESTRIL) 2.5 MG tablet, Take 1  tablet (2.5 mg total) by mouth daily., Disp: 30 tablet, Rfl: 2 .  metFORMIN (GLUCOPHAGE) 850 MG tablet, Take 1 tablet (850 mg total) by mouth 2 (two) times daily with a meal. ( Metformina 850 mg ) - [place] from [place], Disp: 60 tablet, Rfl: 2 .  NIFEdipine (PROCARDIA-XL/ADALAT CC) 30 MG 24 hr tablet, Take 30 mg by mouth daily. ( Nifedipino ) gets from [place], Disp: , Rfl:  .  benzonatate (TESSALON) 200 MG capsule, Take 1 capsule (200 mg total) by mouth 3 (three) times daily as needed for cough., Disp: 30 capsule, Rfl: 0 .  ipratropium (ATROVENT) 0.06 % nasal spray, Place 2 sprays into both nostrils 4 (four) times daily. 3-4 times/ day, Disp: 15 mL, Rfl: 0 .  triamcinolone cream (KENALOG) 0.1 %, Apply 1 application topically 3 (three) times daily., Disp: 30 g, Rfl: 2  No Known Allergies   ROS  As noted in HPI.   Physical Exam  BP 133/72 (BP Location: Left Arm)   Pulse 84   Temp 99 F (37.2 C)   Resp 14   SpO2 95%   Constitutional: Well developed, well nourished, no acute distress Eyes:  EOMI, conjunctiva normal bilaterally HENT: Normocephalic, atraumatic,mucus membranes moist. Positive mild nasal congestion. No sinus tenderness. Normal oropharynx. Positive postnasal drip. Neck: No cervical adenopathy Respiratory: Normal inspiratory effort lungs clear bilaterally, good air movement Cardiovascular: Normal rate regular rhythm, no murmurs rubs or gallops GI: nondistended skin: No rash, skin intact Musculoskeletal: no deformities Neurologic: Alert & oriented x 3, no focal neuro deficits Psychiatric: Speech and behavior  appropriate   ED Course   Medications - No data to display  Orders Placed This Encounter  Procedures  . DG Chest 2 View    Standing Status:   Standing    Number of Occurrences:   1    Order Specific Question:   Reason for Exam (SYMPTOM  OR DIAGNOSIS REQUIRED)    Answer:   h/o PNA cough x 3 days r/o PNA    No results found for this or any previous visit  (from the past 24 hour(s)). Dg Chest 2 View  Result Date: 05/03/2016 CLINICAL DATA:  Cough for 3 days, history of pneumonia, diabetes mellitus EXAM: CHEST  2 VIEW COMPARISON:  09/06/2015 FINDINGS: Upper normal heart size. Mediastinal contours and pulmonary vascularity normal. Atherosclerotic calcification aorta. Subsegmental atelectasis in lingula. Minimal bronchitic changes. Lungs otherwise clear. No infiltrate, pleural effusion or pneumothorax. Osseous structures unremarkable. IMPRESSION: Minimal lingular subsegmental atelectasis. Resolution of pulmonary infiltrate seen on previous exam. Aortic atherosclerosis. Electronically Signed   By: Ulyses SouthwardMark  Boles M.D.   On: 05/03/2016 13:03    ED Clinical Impression  Viral URI with cough   ED Assessment/Plan   vitals are normal, patient in no respiratory distress, lungs are clear We'll check chest x-ray given history of pneumonia to rule out recurrent pneumonia,.   Presentation is most consistent with a viral URI/cough.  Reviewed imaging independently. Subsegmental atelectasis in lingula. Minimal bronchitic changes. No infiltrate, pleural effusion or pneumothorax. No pulmonary infiltrate. See radiology report for details.  plan to treat as viral URI. Home with Mucinex, Tessalon, NSAID/Tylenol , saline nasal irrigation and Atrovent nasal spray to help with the nasal congestion and postnasal drip. Patient follow-up with PMD as needed. To the ER if gets worse.  Discussed imaging, MDM, plan and followup with patient and family. Discussed sn/sx that should prompt return to the ED. Patient and family agrees with plan.   Meds ordered this encounter  Medications  . benzonatate (TESSALON) 200 MG capsule    Sig: Take 1 capsule (200 mg total) by mouth 3 (three) times daily as needed for cough.    Dispense:  30 capsule    Refill:  0  . ipratropium (ATROVENT) 0.06 % nasal spray    Sig: Place 2 sprays into both nostrils 4 (four) times daily. 3-4 times/ day     Dispense:  15 mL    Refill:  0    *This clinic note was created using Scientist, clinical (histocompatibility and immunogenetics)Dragon dictation software. Therefore, there may be occasional mistakes despite careful proofreading.  ?   Domenick GongAshley Jarmarcus Wambold, MD 05/03/16 2210

## 2016-05-03 NOTE — ED Triage Notes (Signed)
[facility] a [facility]. The patient reported that she has had a cough x 3 days with chest congestion. The patient reported that the cough has been productive. The patient did not speak [place] and a family member translated.

## 2017-11-10 IMAGING — DX DG CHEST 2V
2 series · 2 of 2 positions shown · non-contrast
Comparison: None.

CLINICAL DATA: Cough and fever for 5 days. Back pain beginning 2
days ago.

EXAM:
CHEST  2 VIEW

[chest pa]
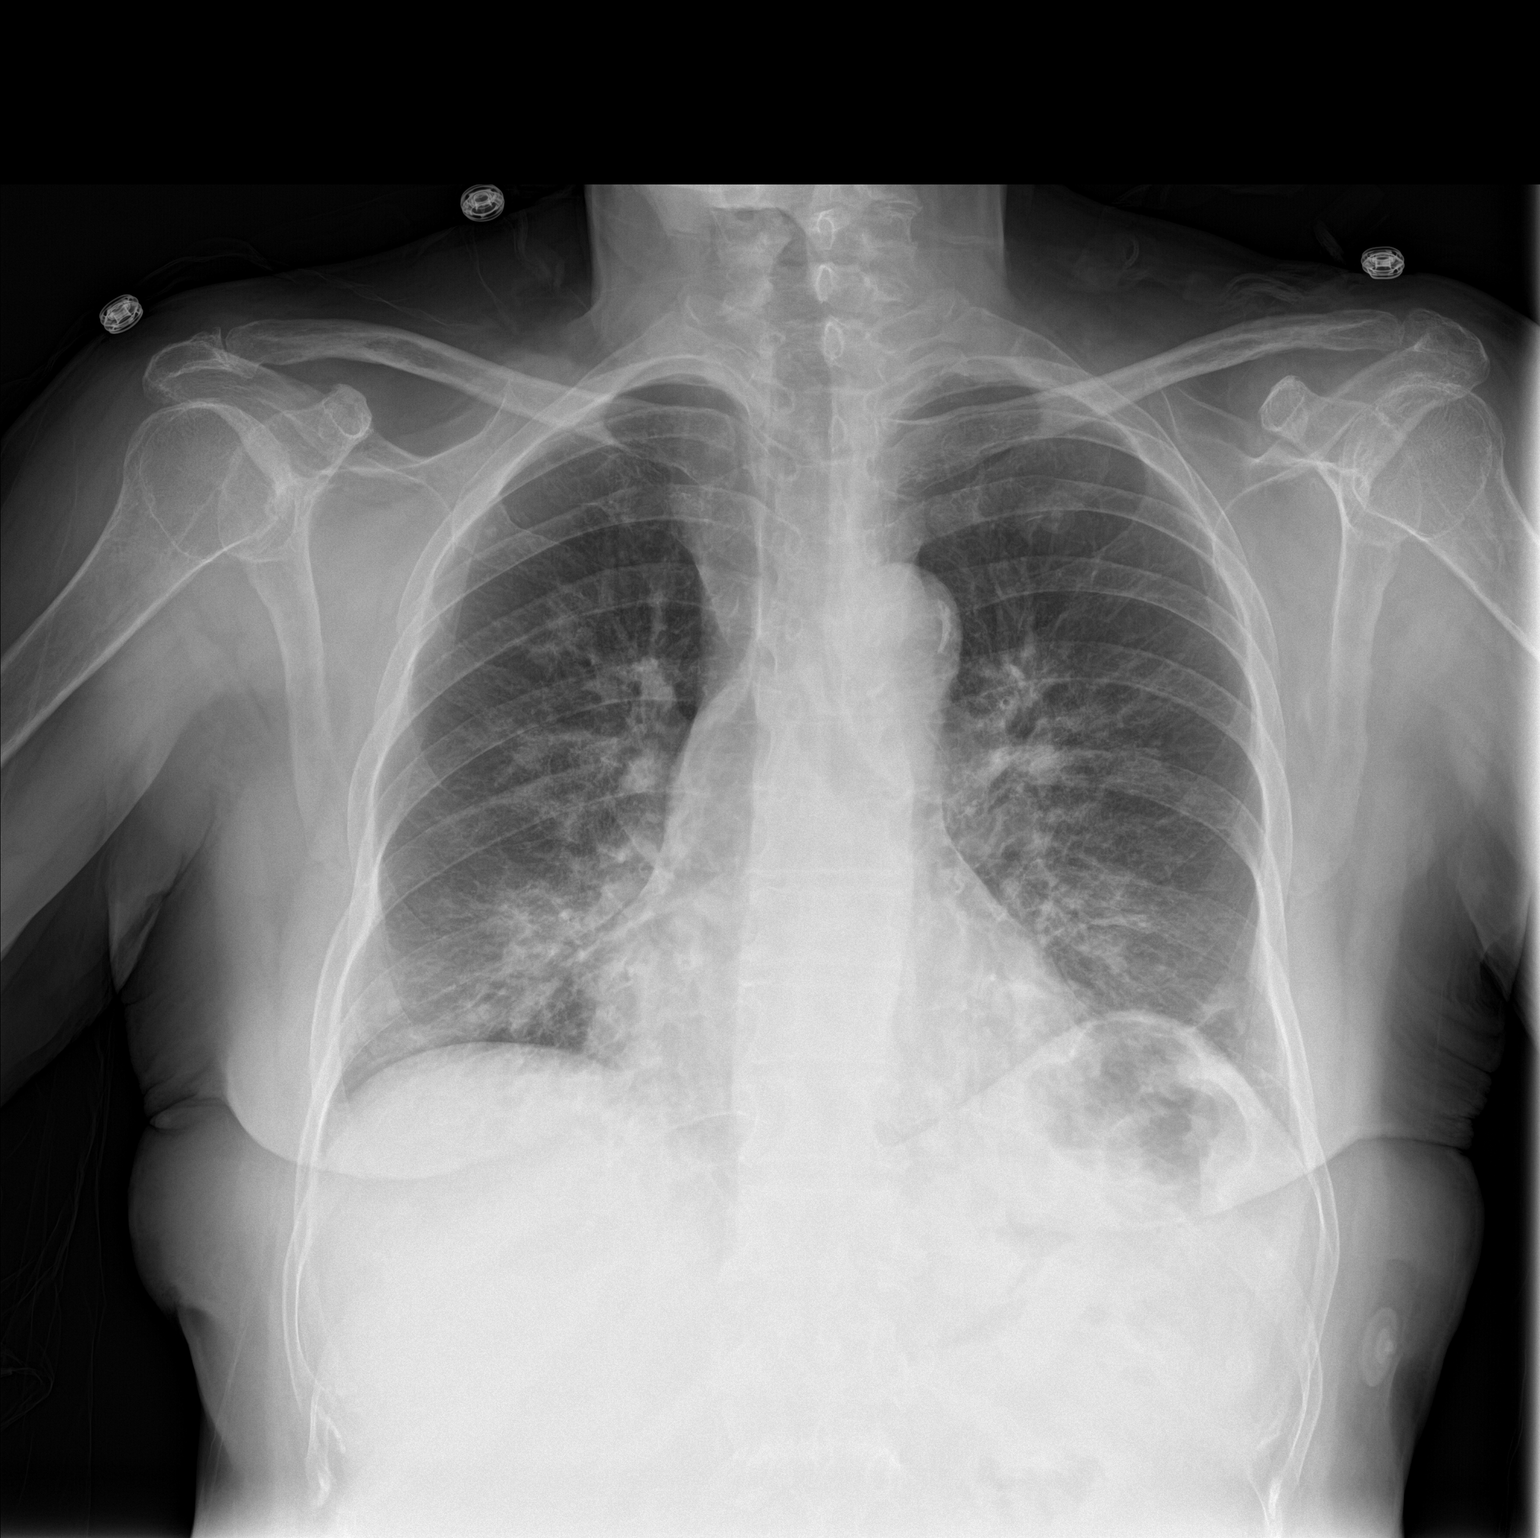

[chest lat]
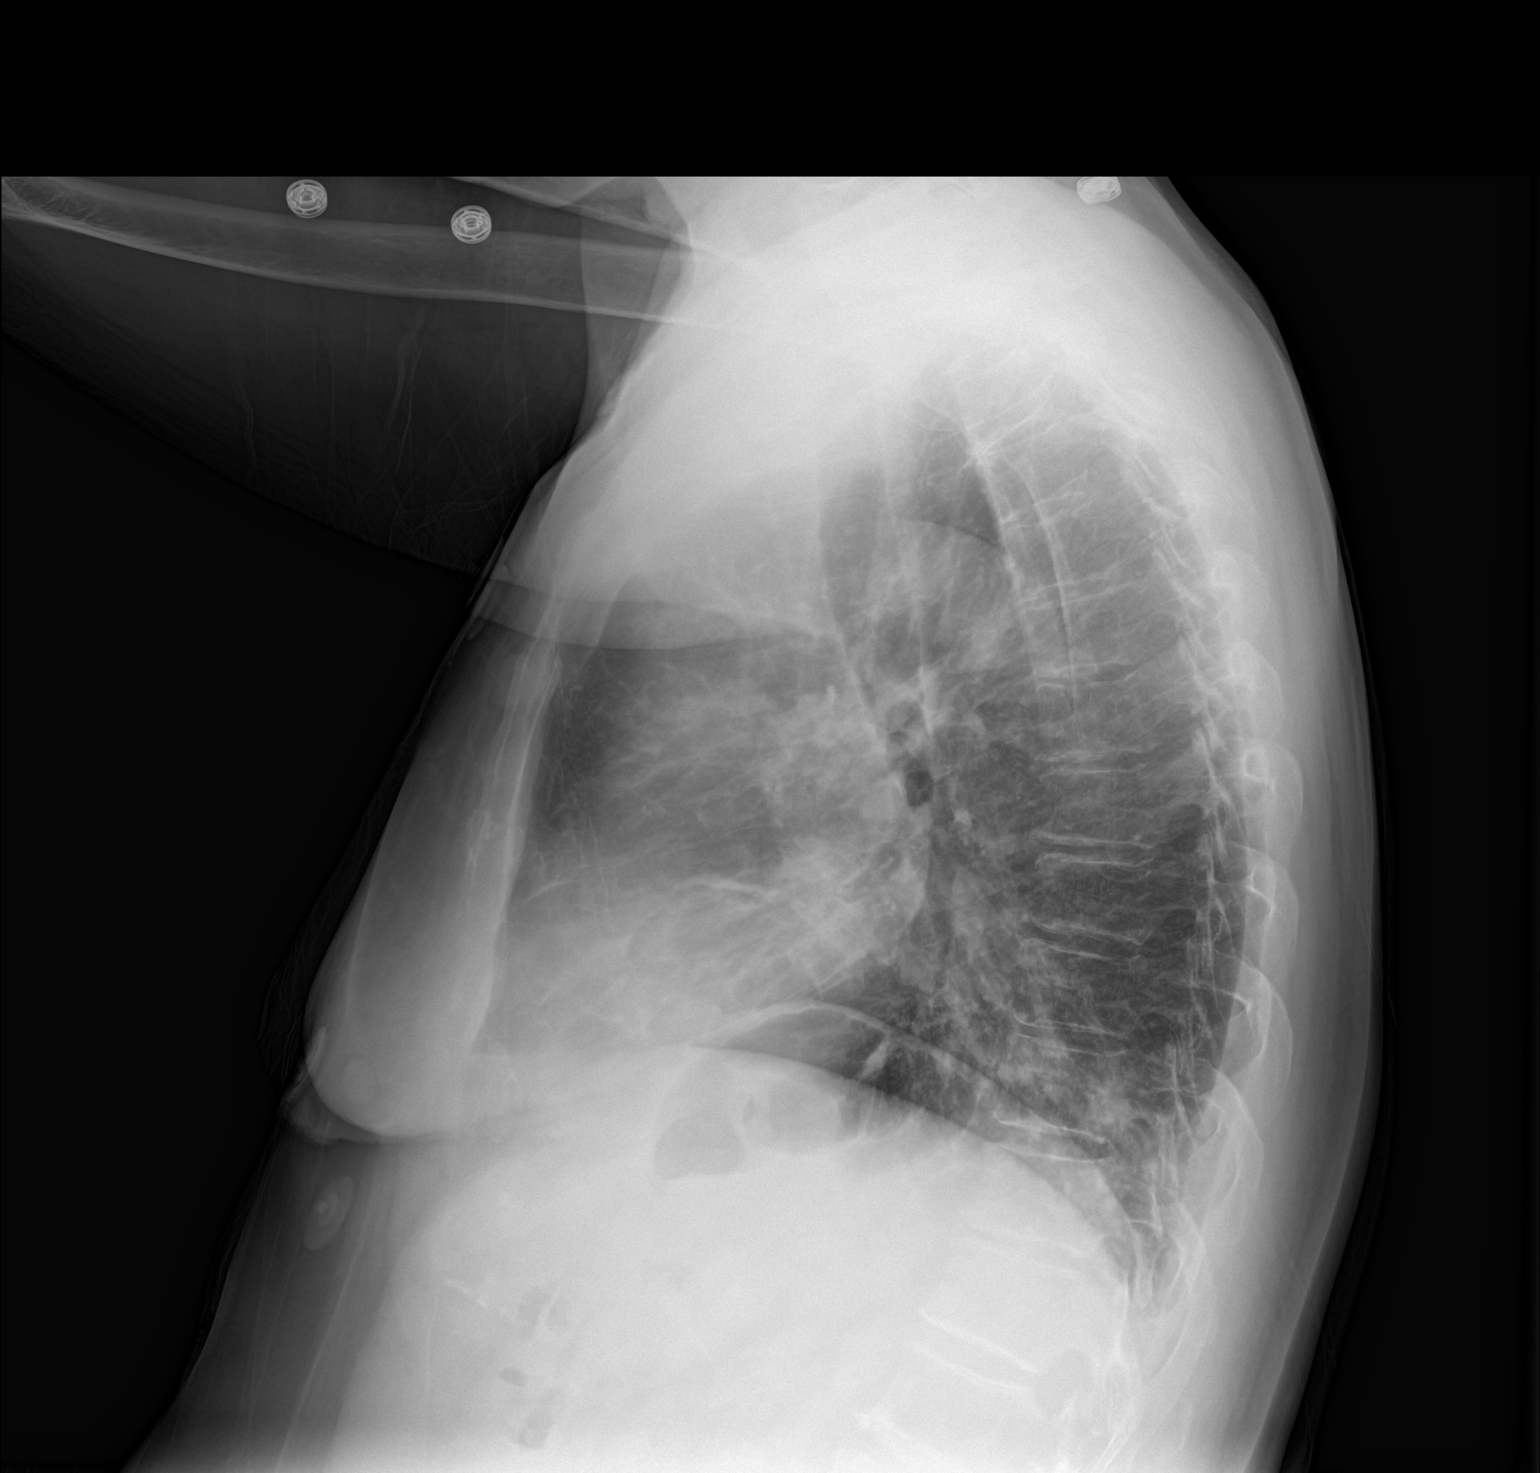

[2 of 2 positions shown; findings below may reference images not displayed]

FINDINGS: There is patchy bilateral airspace disease somewhat worse on the
right. Heart size is normal. No pneumothorax or pleural effusion. No
focal bony abnormality.
IMPRESSION: Patchy bilateral airspace disease has an appearance most suggestive
of multifocal pneumonia rather than edema.

## 2018-11-04 ENCOUNTER — Inpatient Hospital Stay (HOSPITAL_COMMUNITY)
Admission: EM | Admit: 2018-11-04 | Discharge: 2018-11-07 | DRG: 177 | Disposition: A | Discharge status: Home / Self Care | Payer: HRSA Program | Attending: Internal Medicine | Admitting: Internal Medicine

## 2018-11-04 ENCOUNTER — Emergency Department (HOSPITAL_COMMUNITY): Payer: HRSA Program

## 2018-11-04 ENCOUNTER — Encounter (HOSPITAL_COMMUNITY): Payer: Self-pay

## 2018-11-04 DIAGNOSIS — E1165 Type 2 diabetes mellitus with hyperglycemia: Secondary | ICD-10-CM | POA: Diagnosis not present

## 2018-11-04 DIAGNOSIS — Z7984 Long term (current) use of oral hypoglycemic drugs: Secondary | ICD-10-CM

## 2018-11-04 DIAGNOSIS — Z87891 Personal history of nicotine dependence: Secondary | ICD-10-CM

## 2018-11-04 DIAGNOSIS — R0602 Shortness of breath: Secondary | ICD-10-CM | POA: Diagnosis not present

## 2018-11-04 DIAGNOSIS — I10 Essential (primary) hypertension: Secondary | ICD-10-CM | POA: Diagnosis present

## 2018-11-04 DIAGNOSIS — E119 Type 2 diabetes mellitus without complications: Secondary | ICD-10-CM

## 2018-11-04 DIAGNOSIS — J9601 Acute respiratory failure with hypoxia: Secondary | ICD-10-CM | POA: Diagnosis present

## 2018-11-04 DIAGNOSIS — U071 COVID-19: Secondary | ICD-10-CM | POA: Diagnosis not present

## 2018-11-04 DIAGNOSIS — Z7982 Long term (current) use of aspirin: Secondary | ICD-10-CM

## 2018-11-04 DIAGNOSIS — J1289 Other viral pneumonia: Secondary | ICD-10-CM | POA: Diagnosis present

## 2018-11-04 DIAGNOSIS — J069 Acute upper respiratory infection, unspecified: Secondary | ICD-10-CM

## 2018-11-04 DIAGNOSIS — T380X5A Adverse effect of glucocorticoids and synthetic analogues, initial encounter: Secondary | ICD-10-CM | POA: Diagnosis not present

## 2018-11-04 DIAGNOSIS — E861 Hypovolemia: Secondary | ICD-10-CM | POA: Diagnosis present

## 2018-11-04 DIAGNOSIS — E871 Hypo-osmolality and hyponatremia: Secondary | ICD-10-CM | POA: Diagnosis present

## 2018-11-04 NOTE — ED Triage Notes (Addendum)
Pt is spanish speaking only, feeling tired, body aches, SOB, lost taste, fevers and cough, pt has been around family that is COVID positive

## 2018-11-05 ENCOUNTER — Encounter (HOSPITAL_COMMUNITY): Payer: Self-pay | Admitting: Family Medicine

## 2018-11-05 ENCOUNTER — Emergency Department (HOSPITAL_COMMUNITY): Payer: HRSA Program

## 2018-11-05 ENCOUNTER — Other Ambulatory Visit: Payer: Self-pay

## 2018-11-05 DIAGNOSIS — Z87891 Personal history of nicotine dependence: Secondary | ICD-10-CM | POA: Diagnosis not present

## 2018-11-05 DIAGNOSIS — T380X5A Adverse effect of glucocorticoids and synthetic analogues, initial encounter: Secondary | ICD-10-CM | POA: Diagnosis not present

## 2018-11-05 DIAGNOSIS — R0602 Shortness of breath: Secondary | ICD-10-CM | POA: Diagnosis present

## 2018-11-05 DIAGNOSIS — U071 COVID-19: Secondary | ICD-10-CM | POA: Diagnosis present

## 2018-11-05 DIAGNOSIS — E119 Type 2 diabetes mellitus without complications: Secondary | ICD-10-CM | POA: Diagnosis not present

## 2018-11-05 DIAGNOSIS — J9601 Acute respiratory failure with hypoxia: Secondary | ICD-10-CM | POA: Diagnosis not present

## 2018-11-05 DIAGNOSIS — E1165 Type 2 diabetes mellitus with hyperglycemia: Secondary | ICD-10-CM | POA: Diagnosis not present

## 2018-11-05 DIAGNOSIS — J1289 Other viral pneumonia: Secondary | ICD-10-CM

## 2018-11-05 DIAGNOSIS — J069 Acute upper respiratory infection, unspecified: Secondary | ICD-10-CM | POA: Insufficient documentation

## 2018-11-05 DIAGNOSIS — E871 Hypo-osmolality and hyponatremia: Secondary | ICD-10-CM

## 2018-11-05 DIAGNOSIS — I10 Essential (primary) hypertension: Secondary | ICD-10-CM

## 2018-11-05 DIAGNOSIS — Z7984 Long term (current) use of oral hypoglycemic drugs: Secondary | ICD-10-CM | POA: Diagnosis not present

## 2018-11-05 DIAGNOSIS — Z7982 Long term (current) use of aspirin: Secondary | ICD-10-CM | POA: Diagnosis not present

## 2018-11-05 DIAGNOSIS — E861 Hypovolemia: Secondary | ICD-10-CM | POA: Diagnosis not present

## 2018-11-05 DIAGNOSIS — J1282 Pneumonia due to coronavirus disease 2019: Secondary | ICD-10-CM | POA: Diagnosis present

## 2018-11-05 LAB — COMPREHENSIVE METABOLIC PANEL
ALT: 13 U/L (ref 0–44)
AST: 23 U/L (ref 15–41)
Albumin: 3.1 g/dL — ABNORMAL LOW (ref 3.5–5.0)
Alkaline Phosphatase: 97 U/L (ref 38–126)
Anion gap: 10 (ref 5–15)
BUN: 15 mg/dL (ref 8–23)
CO2: 24 mmol/L (ref 22–32)
Calcium: 8.6 mg/dL — ABNORMAL LOW (ref 8.9–10.3)
Chloride: 94 mmol/L — ABNORMAL LOW (ref 98–111)
Creatinine, Ser: 0.6 mg/dL (ref 0.44–1.00)
GFR calc Af Amer: >60 mL/min (ref >60)
GFR calc non Af Amer: >60 mL/min (ref >60)
Glucose, Bld: 292 mg/dL — ABNORMAL HIGH (ref 70–99)
Potassium: 5.3 mmol/L — ABNORMAL HIGH (ref 3.5–5.1)
Sodium: 128 mmol/L — ABNORMAL LOW (ref 135–145)
Total Bilirubin: 0.6 mg/dL (ref 0.3–1.2)
Total Protein: 7.4 g/dL (ref 6.5–8.1)

## 2018-11-05 LAB — ABO/RH: ABO/RH(D): O POS

## 2018-11-05 LAB — CBC WITH DIFFERENTIAL/PLATELET
Abs Immature Granulocytes: 0.03 10*3/uL (ref 0.00–0.07)
Basophils Absolute: 0 10*3/uL (ref 0.0–0.1)
Basophils Relative: 0 %
Eosinophils Absolute: 0 10*3/uL (ref 0.0–0.5)
Eosinophils Relative: 0 %
HCT: 37.2 % (ref 36.0–46.0)
Hemoglobin: 12.1 g/dL (ref 12.0–15.0)
Immature Granulocytes: 1 %
Lymphocytes Relative: 8 %
Lymphs Abs: 0.5 10*3/uL — ABNORMAL LOW (ref 0.7–4.0)
MCH: 27.1 pg (ref 26.0–34.0)
MCHC: 32.5 g/dL (ref 30.0–36.0)
MCV: 83.4 fL (ref 80.0–100.0)
Monocytes Absolute: 0.4 10*3/uL (ref 0.1–1.0)
Monocytes Relative: 6 %
Neutro Abs: 5.5 10*3/uL (ref 1.7–7.7)
Neutrophils Relative %: 85 %
Platelets: 250 10*3/uL (ref 150–400)
RBC: 4.46 MIL/uL (ref 3.87–5.11)
RDW: 13.2 % (ref 11.5–15.5)
WBC: 6.4 10*3/uL (ref 4.0–10.5)
nRBC: 0 % (ref 0.0–0.2)

## 2018-11-05 LAB — FIBRINOGEN: Fibrinogen: 766 mg/dL — ABNORMAL HIGH (ref 210–475)

## 2018-11-05 LAB — C-REACTIVE PROTEIN: CRP: 15 mg/dL — ABNORMAL HIGH (ref <1.0)

## 2018-11-05 LAB — GLUCOSE, CAPILLARY
Glucose-Capillary: 100 mg/dL — ABNORMAL HIGH (ref 70–99)
Glucose-Capillary: 116 mg/dL — ABNORMAL HIGH (ref 70–99)
Glucose-Capillary: 251 mg/dL — ABNORMAL HIGH (ref 70–99)
Glucose-Capillary: 385 mg/dL — ABNORMAL HIGH (ref 70–99)

## 2018-11-05 LAB — SARS CORONAVIRUS 2 BY RT PCR (HOSPITAL ORDER, PERFORMED IN ~~LOC~~ HOSPITAL LAB): SARS Coronavirus 2: POSITIVE — AB

## 2018-11-05 LAB — PROCALCITONIN: Procalcitonin: <0.1 ng/mL

## 2018-11-05 LAB — D-DIMER, QUANTITATIVE: D-Dimer, Quant: 1.07 ug/mL-FEU — ABNORMAL HIGH (ref 0.00–0.50)

## 2018-11-05 LAB — LACTATE DEHYDROGENASE: LDH: 218 U/L — ABNORMAL HIGH (ref 98–192)

## 2018-11-05 MED ORDER — ONDANSETRON HCL 4 MG PO TABS: 4.0000 mg | ORAL_TABLET | Freq: Four times a day (QID) | ORAL | Status: DC | PRN

## 2018-11-05 MED ORDER — BISACODYL 5 MG PO TBEC: 5.0000 mg | DELAYED_RELEASE_TABLET | Freq: Every day | ORAL | Status: DC | PRN

## 2018-11-05 MED ORDER — HYDROCODONE-ACETAMINOPHEN 5-325 MG PO TABS: 1.0000 | ORAL_TABLET | ORAL | Status: DC | PRN

## 2018-11-05 MED ORDER — TOCILIZUMAB 400 MG/20ML IV SOLN
8.0000 mg/kg | Freq: Once | INTRAVENOUS | Status: AC
  Administered 2018-11-05: 14:00:00 512 mg via INTRAVENOUS
  Filled 2018-11-05: qty 25.6

## 2018-11-05 MED ORDER — ONDANSETRON HCL 4 MG/2ML IJ SOLN: 4.0000 mg | Freq: Four times a day (QID) | INTRAMUSCULAR | Status: DC | PRN

## 2018-11-05 MED ORDER — ENOXAPARIN SODIUM 40 MG/0.4ML ~~LOC~~ SOLN
40.0000 mg | SUBCUTANEOUS | Status: DC
  Administered 2018-11-05 – 2018-11-07 (×3): 40 mg via SUBCUTANEOUS
  Filled 2018-11-05 (×3): qty 0.4

## 2018-11-05 MED ORDER — POLYETHYLENE GLYCOL 3350 17 G PO PACK: 17.0000 g | PACK | Freq: Every day | ORAL | Status: DC | PRN

## 2018-11-05 MED ORDER — SODIUM CHLORIDE 0.9 % IV BOLUS
500.0000 mL | Freq: Once | INTRAVENOUS | Status: AC
  Administered 2018-11-05: 500 mL via INTRAVENOUS

## 2018-11-05 MED ORDER — INSULIN ASPART 100 UNIT/ML ~~LOC~~ SOLN
0.0000 [IU] | Freq: Three times a day (TID) | SUBCUTANEOUS | Status: DC
  Administered 2018-11-05 – 2018-11-06 (×2): 5 [IU] via SUBCUTANEOUS
  Administered 2018-11-06: 12:00:00 9 [IU] via SUBCUTANEOUS

## 2018-11-05 MED ORDER — ZOLPIDEM TARTRATE 5 MG PO TABS: 5.0000 mg | ORAL_TABLET | Freq: Every evening | ORAL | Status: DC | PRN

## 2018-11-05 MED ORDER — INSULIN ASPART 100 UNIT/ML ~~LOC~~ SOLN
0.0000 [IU] | Freq: Every day | SUBCUTANEOUS | Status: DC
  Administered 2018-11-05: 5 [IU] via SUBCUTANEOUS

## 2018-11-05 MED ORDER — ACETAMINOPHEN 325 MG PO TABS: 650.0000 mg | ORAL_TABLET | Freq: Four times a day (QID) | ORAL | Status: DC | PRN

## 2018-11-05 MED ORDER — METHYLPREDNISOLONE SODIUM SUCC 40 MG IJ SOLR
40.0000 mg | Freq: Two times a day (BID) | INTRAMUSCULAR | Status: DC
  Administered 2018-11-05 – 2018-11-07 (×5): 40 mg via INTRAVENOUS
  Filled 2018-11-05 (×6): qty 1

## 2018-11-05 NOTE — ED Notes (Signed)
Pt gives verbal consent for treatment. Pen pad in room not working.

## 2018-11-05 NOTE — H&P (Signed)
History and Physical    Ashlee Rivera ZOX:[id number] DOB: 05-Jul-1945 DOA: 11/04/2018  PCP: Hoy RegisterNewlin, Enobong, MD   Patient coming from: Home   Chief Complaint: Fever, fatigue,   HPI: Ashlee Rivera is a 73 y.o. female with medical history significant for type 2 diabetes mellitus and hypertension, now presenting to the emergency department for evaluation of fevers, fatigue, loss of appetite, and diminished sense of taste.  Patient reports that the symptoms developed 2 or 3 days ago and have been progressively worsening.  She has not eaten or drinking much of anything over this interval.  She had been visiting a family member a little over a week ago that was later found to have COVID-19.  Patient denies any significant cough, does not feel particularly short of breath, but feels very fatigued which is worse with any exertion.  She denies any leg swelling or tenderness, rash, abdominal pain, vomiting, or diarrhea.  ED Course: Upon arrival to the ED, patient is found to have a temperature of 37.9 C, saturating upper 80s on room air while at rest, tachypneic to 30, and with stable blood pressure and heart rate.  EKG features a sinus rhythm with rate 95.  Chest x-ray features of multifocal airspace opacities highly concerning for multifocal pneumonia.  Chemistry panel is notable for hyponatremia, slight hyperkalemia, and CBC features a leukopenia.  COVID-19 testing is positive.  Patient was started on supplemental oxygen and hospitalists asked to admit.   Review of Systems:  All other systems reviewed and apart from HPI, are negative.  Past Medical History:  Diagnosis Date  . Diabetes mellitus without complication [facility](HCC)     Past Surgical History:  Procedure Laterality Date  . BLADDER SURGERY       reports that she has quit smoking. She has never used smokeless tobacco. She reports that she does not drink alcohol or use drugs.  No Known Allergies  History reviewed. No  pertinent family history.   Prior to Admission medications   Medication Sig Start Date End Date Taking? Authorizing Provider  Aspirin (ASPIR-LOW PO) Take 150 mg by mouth daily.    [provider]  benzonatate (TESSALON) 200 MG capsule Take 1 capsule (200 mg total) by mouth 3 (three) times daily as needed for cough. 05/03/16   Domenick GongMortenson, Ashley, MD  glyBURIDE (DIABETA) 5 MG tablet Take 1 tablet (5 mg total) by mouth 2 (two) times daily with a meal. 10/06/15   Newlin, Enobong, MD  ipratropium (ATROVENT) 0.06 % nasal spray Place 2 sprays into both nostrils 4 (four) times daily. 3-4 times/ day 05/03/16   Domenick GongMortenson, Ashley, MD  lisinopril (PRINIVIL,ZESTRIL) 2.5 MG tablet Take 1 tablet (2.5 mg total) by mouth daily. 10/13/15   Hoy RegisterNewlin, Enobong, MD  metFORMIN (GLUCOPHAGE) 850 MG tablet Take 1 tablet (850 mg total) by mouth 2 (two) times daily with a meal. ( Metformina 850 mg ) - pt gets [place] from [place] 10/06/15   Hoy RegisterNewlin, Enobong, MD  NIFEdipine (PROCARDIA-XL/ADALAT CC) 30 MG 24 hr tablet Take 30 mg by mouth daily. ( Nifedipino ) gets from [place]    [provider]  triamcinolone cream (KENALOG) 0.1 % Apply 1 application topically 3 (three) times daily. 10/06/15   Hoy RegisterNewlin, Enobong, MD    Physical Exam: Vitals:   11/05/18 0053 11/05/18 0100 11/05/18 0200 11/05/18 0300  BP:  (!) 132/53 (!) 123/55 (!) 121/56  Pulse:  86 81 79  Resp:  (!) 25 (!) 28 (!) 22  Temp:  TempSrc:      SpO2:  92% 93% 95%  Weight: 64 kg       Constitutional: NAD, appears uncomfortable  Eyes: PERTLA, lids and conjunctivae normal ENMT: Mucous membranes are moist. Posterior pharynx clear of any exudate or lesions.   Neck: normal, supple, no masses, no thyromegaly Respiratory: Tachypneic at rest. No pallor or cyanosis. No accessory muscle use.  Cardiovascular: S1 & S2 heard, regular rate and rhythm. No extremity edema.   Abdomen: No distension, no tenderness, soft. Bowel sounds normal.  Musculoskeletal: no  clubbing / cyanosis. No joint deformity upper and lower extremities.    Skin: no significant rashes, lesions, ulcers. Warm, dry, well-perfused. Neurologic: Dysconjugate gaze. No facial asymmetry. Sensation intact. Moving all extremities.  Psychiatric: Alert and oriented to person, place, and situation. Calm, cooperative.    Labs on Admission: I have personally reviewed following labs and imaging studies  CBC: Recent Labs  Lab 11/05/18 0037  WBC 6.4  NEUTROABS 5.5  HGB 12.1  HCT 37.2  MCV 83.4  PLT 250   Basic Metabolic Panel: Recent Labs  Lab 11/05/18 0037  NA 128*  K 5.3*  CL 94*  CO2 24  GLUCOSE 292*  BUN 15  CREATININE 0.60  CALCIUM 8.6*   GFR: CrCl cannot be calculated (Unknown ideal weight.). Liver Function Tests: Recent Labs  Lab 11/05/18 0037  AST 23  ALT 13  ALKPHOS 97  BILITOT 0.6  PROT 7.4  ALBUMIN 3.1*   No results for input(s): LIPASE, AMYLASE in the last 168 hours. No results for input(s): AMMONIA in the last 168 hours. Coagulation Profile: No results for input(s): INR, PROTIME in the last 168 hours. Cardiac Enzymes: No results for input(s): CKTOTAL, CKMB, CKMBINDEX, TROPONINI in the last 168 hours. BNP (last 3 results) No results for input(s): PROBNP in the last 8760 hours. HbA1C: No results for input(s): HGBA1C in the last 72 hours. CBG: No results for input(s): GLUCAP in the last 168 hours. Lipid Profile: No results for input(s): CHOL, HDL, LDLCALC, TRIG, CHOLHDL, LDLDIRECT in the last 72 hours. Thyroid Function Tests: No results for input(s): TSH, T4TOTAL, FREET4, T3FREE, THYROIDAB in the last 72 hours. Anemia Panel: No results for input(s): VITAMINB12, FOLATE, FERRITIN, TIBC, IRON, RETICCTPCT in the last 72 hours. Urine analysis: No results found for: COLORURINE, APPEARANCEUR, LABSPEC, PHURINE, GLUCOSEU, HGBUR, BILIRUBINUR, KETONESUR, PROTEINUR, UROBILINOGEN, NITRITE, LEUKOCYTESUR Sepsis Labs:  (procalcitonin:4,lacticidven:4) ) Recent Results (from the past 240 hour(s))  SARS Coronavirus 2 [facility] order, Performed in [facility] hospital lab)     Status: Abnormal   Collection Time: 11/05/18 12:51 AM  Result Value Ref Range Status   SARS Coronavirus 2 POSITIVE (A) NEGATIVE Final    Comment: RESULT CALLED TO, READ BACK BY AND VERIFIED WITH: ALissa Merlin 0221 11/05/2018 T. TYSOR (NOTE) If result is NEGATIVE SARS-CoV-2 target nucleic acids are NOT DETECTED. The SARS-CoV-2 RNA is generally detectable in upper and lower  respiratory specimens during the acute phase of infection. The lowest  concentration of SARS-CoV-2 viral copies this assay can detect is 250  copies / mL. A negative result does not preclude SARS-CoV-2 infection  and should not be used as the sole basis for treatment or other  patient management decisions.  A negative result may occur with  improper specimen collection / handling, submission of specimen other  than nasopharyngeal swab, presence of viral mutation(s) within the  areas targeted by this assay, and inadequate number of viral copies  (<250 copies / mL). A negative  result must be combined with clinical  observations, patient history, and epidemiological information. If result is POSITIVE SARS-CoV-2 target nucleic acids are DETECTED.  The SARS-CoV-2 RNA is generally detectable in upper and lower  respiratory specimens during the acute phase of infection.  Positive  results are indicative of active infection with SARS-CoV-2.  Clinical  correlation with patient history and other diagnostic information is  necessary to determine patient infection status.  Positive results do  not rule out bacterial infection or co-infection with other viruses. If result is PRESUMPTIVE POSTIVE SARS-CoV-2 nucleic acids MAY BE PRESENT.   A presumptive positive result was obtained on the submitted specimen  and confirmed on repeat testing.  While 2019 novel  coronavirus  (SARS-CoV-2) nucleic acids may be present in the submitted sample  additional confirmatory testing may be necessary for epidemiological  and / or clinical management purposes  to differentiate between  SARS-CoV-2 and other Sarbecovirus currently known to infect humans.  If clinically indicated additional testing with an alternate test  methodology [phone])  is advised. The SARS-CoV-2 RNA is generally  detectable in upper and lower respiratory specimens during the acute  phase of infection. The expected result is Negative. Fact Sheet for Patients:  BoilerBrush.com.cy Fact Sheet for Healthcare Providers: https://pope.com/ This test is not yet approved or cleared by the [place] FDA and has been authorized for detection and/or diagnosis of SARS-CoV-2 by FDA under an Emergency Use Authorization (EUA).  This EUA will remain in effect (meaning this test can be used) for the duration of the COVID-19 declaration under Section 564(b)(1) of the Act, 21 U.S.C. section 360bbb-3(b)(1), unless the authorization is terminated or revoked sooner. Performed at [facility], [address] [place], [place] [address]      Radiological Exams on Admission: Dg Chest Port 1 View  Result Date: 11/05/2018 CLINICAL DATA:  Cough and fever EXAM: PORTABLE CHEST 1 VIEW COMPARISON:  05/03/2016 FINDINGS: There are diffuse hazy bilateral airspace opacities. No pneumothorax. No large pleural effusion. The cardiac silhouette is enlarged. Old healed right-sided rib fractures are noted. There is no acute osseous abnormality. IMPRESSION: Multifocal airspace opacities highly concerning for multifocal pneumonia (viral or bacterial). Borderline enlarged heart. Electronically Signed   By: Katherine Mantle M.D.   On: 11/05/2018 01:09    EKG: Independently reviewed. Sinus rhythm, rate 95.   Assessment/Plan   1. COVID-19 Pneumonia; acute hypoxic  respiratory failure  - Presents with 3 days of fever, fatigue, loss of appetite, and loss of taste ~1 wk after being with a family member with confirmed COVID-19  - COVID-19 is positive, there are diffuse b/l AS opacities on CXR, and she is tachypneic and requiring 2 Lpm supplemental O2 while at rest  - Continue supplemental O2 and supportive care   2. Hyponatremia   - Serum sodium is 128 in setting of hyperglycemia and hypovolemia  - She is getting a small IVF bolus and glycemic-control will be achieved with sliding-scale Novolog  - Repeat chem panel in am    3. Type II DM - No recent A1c on file  - Managed at home with glyburide and metformin, held on admission  - Use SSI with Novolog while in hospital    4. Hypertension  - BP at goal  - Hold lisinopril in light of elevated potassium     PPE: Mask, face shield  DVT prophylaxis: Lovenox Code Status: Full Family Communication: Daughter, grandson updated by phone at patient's request  Consults called: None  Admission status: Inpatient     Briscoe Deutscher, MD [facility] Pager [phone]  If 7PM-7AM, please contact night-coverage [email or web address] Password [facility]  11/05/2018, 3:25 AM

## 2018-11-05 NOTE — ED Provider Notes (Signed)
[facility] EMERGENCY [facility] Provider Note   CSN: [id number] Arrival date & time: 11/04/18  2256    History   Chief Complaint Chief Complaint  Patient presents with  . Shortness of Breath    HPI Ashlee Rivera is a 73 y.o. female.     Patient presents to the emergency department with a chief complaint of fevers, chills, and body aches.  She reports recent exposure to family member with coronavirus.  She states that she has had a slight nonproductive cough.  She feels very fatigued.  She denies any history of COPD or asthma.  She denies any chest pain or shortness of breath.  Denies any nausea, vomiting, diarrhea.  Denies any treatments prior to arrival.  The history is provided by the patient. No language interpreter was used.    Past Medical History:  Diagnosis Date  . Diabetes mellitus without complication [facility](HCC)     Patient Active Problem List   Diagnosis Date Noted  . Essential hypertension 10/06/2015  . Type 2 diabetes mellitus without complication, without long-term current use of insulin (HCC) 09/11/2015  . CAP (community acquired pneumonia) 09/06/2015  . Hypoxia   . Rash     Past Surgical History:  Procedure Laterality Date  . BLADDER SURGERY       OB History   No obstetric history on file.      Home Medications    Prior to Admission medications   Medication Sig Start Date End Date Taking? Authorizing Provider  Aspirin (ASPIR-LOW PO) Take 150 mg by mouth daily.    [provider]  benzonatate (TESSALON) 200 MG capsule Take 1 capsule (200 mg total) by mouth 3 (three) times daily as needed for cough. 05/03/16   Domenick GongMortenson, Ashley, MD  glyBURIDE (DIABETA) 5 MG tablet Take 1 tablet (5 mg total) by mouth 2 (two) times daily with a meal. 10/06/15   Newlin, Enobong, MD  ipratropium (ATROVENT) 0.06 % nasal spray Place 2 sprays into both nostrils 4 (four) times daily. 3-4 times/ day 05/03/16   Domenick GongMortenson, Ashley, MD   lisinopril (PRINIVIL,ZESTRIL) 2.5 MG tablet Take 1 tablet (2.5 mg total) by mouth daily. 10/13/15   Hoy RegisterNewlin, Enobong, MD  metFORMIN (GLUCOPHAGE) 850 MG tablet Take 1 tablet (850 mg total) by mouth 2 (two) times daily with a meal. ( Metformina 850 mg ) - [place] from [place] 10/06/15   Hoy RegisterNewlin, Enobong, MD  NIFEdipine (PROCARDIA-XL/ADALAT CC) 30 MG 24 hr tablet Take 30 mg by mouth daily. ( Nifedipino ) gets from [place]    [provider]  triamcinolone cream (KENALOG) 0.1 % Apply 1 application topically 3 (three) times daily. 10/06/15   Hoy RegisterNewlin, Enobong, MD    Family History No family history on file.  Social History Social History   Tobacco Use  . Smoking status: Former Games developermoker  . Smokeless tobacco: Never Used  Substance Use Topics  . Alcohol use: No  . Drug use: No     Allergies   Patient has no known allergies.   Review of Systems Review of Systems  All other systems reviewed and are negative.    Physical Exam Updated Vital Signs BP (!) 132/45 (BP Location: Left Arm)   Pulse 95   Temp 100.2 F (37.9 C) (Oral)   Resp 19   SpO2 (!) 89% Comment: RN, Enis GashJessica F., notified.  Physical Exam Vitals signs and nursing note reviewed.  Constitutional:      General: She is not in acute distress.  Appearance: She is well-developed.  HENT:     Head: Normocephalic and atraumatic.  Eyes:     Conjunctiva/sclera: Conjunctivae normal.  Neck:     Musculoskeletal: Neck supple.  Cardiovascular:     Rate and Rhythm: Normal rate and regular rhythm.     Heart sounds: No murmur.  Pulmonary:     Effort: Pulmonary effort is normal. No respiratory distress.     Breath sounds: Normal breath sounds.     Comments: Clear to auscultation bilaterally Abdominal:     Palpations: Abdomen is soft.     Tenderness: There is no abdominal tenderness.  Skin:    General: Skin is warm and dry.     Comments: Normal appearance of distal extremities  Neurological:     Mental Status: She is  alert and oriented to person, place, and time.  Psychiatric:        Mood and Affect: Mood normal.        Behavior: Behavior normal.      ED Treatments / Results  Labs (all labs ordered are listed, but only abnormal results are displayed) Labs Reviewed  SARS CORONAVIRUS 2 (HOSPITAL ORDER, PERFORMED IN [facility] [facility]) - Abnormal; Notable for the following components:      Result Value   SARS Coronavirus 2 POSITIVE (*)    All other components within normal limits  D-DIMER, QUANTITATIVE (NOT AT [facility]) - Abnormal; Notable for the following components:   D-Dimer, Quant 1.07 (*)    All other components within normal limits  CBC WITH DIFFERENTIAL/PLATELET - Abnormal; Notable for the following components:   Lymphs Abs 0.5 (*)    All other components within normal limits  COMPREHENSIVE METABOLIC PANEL - Abnormal; Notable for the following components:   Sodium 128 (*)    Potassium 5.3 (*)    Chloride 94 (*)    Glucose, Bld 292 (*)    Calcium 8.6 (*)    Albumin 3.1 (*)    All other components within normal limits  FIBRINOGEN - Abnormal; Notable for the following components:   Fibrinogen 766 (*)    All other components within normal limits  LACTATE DEHYDROGENASE - Abnormal; Notable for the following components:   LDH 218 (*)    All other components within normal limits  ABO/RH    EKG None  Radiology No results found.  Procedures Procedures (including critical care time)  Medications Ordered in ED Medications - No data to display   Initial Impression / Assessment and Plan / ED Course  I have reviewed the triage vital signs and the nursing notes.  Pertinent labs & imaging results that were available during my care of the patient were reviewed by me and considered in my medical decision making (see chart for details).       Patient with exposure to COVID-19.  Presents febrile and slightly hypoxic to the emergency department.  She has had slight cough.  Labs and  imaging ordered.  Coronavirus positive.  Appreciate Dr. Antionette Char for admitting the patient.  Rebekan Cranmer was evaluated in Emergency Department on 11/05/2018 for the symptoms described in the history of present illness. She was evaluated in the context of the global COVID-19 pandemic, which necessitated consideration that the patient might be at risk for infection with the SARS-CoV-2 virus that causes COVID-19. Institutional protocols and algorithms that pertain to the evaluation of patients at risk for COVID-19 are in a state of rapid change based on information released by regulatory bodies including the  CDC and federal and [employer]. These policies and algorithms were followed during the patient's care in the ED.   Final Clinical Impressions(s) / ED Diagnoses   Final diagnoses:  Acute respiratory disease due to 2019 novel coronavirus    ED Discharge Orders    None       Roxy Horseman, PA-C 11/05/18 0319    Mesner, Barbara Cower, MD 11/05/18 [phone]

## 2018-11-05 NOTE — ED Notes (Signed)
ED TO INPATIENT HANDOFF REPORT  ED Nurse Name and Phone #: Jeanette Caprice  S Name/Age/Gender Ashlee Rivera 73 y.o. female Room/Bed: 019C/019C  Code Status   Code Status: Full Code  Home/SNF/Other Home Patient oriented to: self, place, time and situation Is this baseline? Yes   Triage Complete: Triage complete  Chief Complaint Weakness  Triage Note Pt is spanish speaking only, feeling tired, body aches, SOB, lost taste, fevers and cough, pt has been around family that is COVID positive    Allergies No Known Allergies  Level of Care/Admitting Diagnosis ED Disposition    ED Disposition Condition Comment   Admit  Hospital Area: [facility] [[id number]]  Level of Care: Med-Surg [16]  Covid Evaluation: Confirmed COVID Positive  Isolation Risk Level: Low Risk/Droplet (Less than 4L [place] supplementation)  Diagnosis: Pneumonia due to COVID-19 virus [[id number]]  Admitting Physician: Briscoe Deutscher [[id number]]  Attending Physician: Briscoe Deutscher [[id number]]  Estimated length of stay: past midnight tomorrow  Certification:: I certify this patient will need inpatient services for at least 2 midnights  PT Class (Do Not Modify): Inpatient [101]  PT Acc Code (Do Not Modify): Private [1]       B Medical/Surgery History Past Medical History:  Diagnosis Date  . Diabetes mellitus without complication [facility])    Past Surgical History:  Procedure Laterality Date  . BLADDER SURGERY       A IV Location/Drains/Wounds Patient Lines/Drains/Airways Status   Active Line/Drains/Airways    Name:   Placement date:   Placement time:   Site:   Days:   Peripheral IV 11/05/18 Left Antecubital   11/05/18    0303    Antecubital   less than 1          Intake/Output Last 24 hours No intake or output data in the 24 hours ending 11/05/18 0329  Labs/Imaging Results for orders placed or performed during the hospital encounter of 11/04/18 (from the past 48 hour(s))  ABO/Rh      Status: None   Collection Time: 11/05/18 12:37 AM  Result Value Ref Range   ABO/RH(D)      O POS Performed at [facility], [address], [place], [place] [address]   D-dimer, quantitative (not at [facility])     Status: Abnormal   Collection Time: 11/05/18 12:37 AM  Result Value Ref Range   D-Dimer, Quant 1.07 (H) 0.00 - 0.50 ug/mL-FEU    Comment: (NOTE) At the manufacturer cut-off of 0.50 ug/mL FEU, this assay has been documented to exclude PE with a sensitivity and negative predictive value of 97 to 99%.  At this time, this assay has not been approved by the FDA to exclude DVT/VTE. Results should be correlated with clinical presentation. Performed at [facility], [address] [place], [place] [address]   CBC with Differential/Platelet     Status: Abnormal   Collection Time: 11/05/18 12:37 AM  Result Value Ref Range   WBC 6.4 4.0 - 10.5 K/uL   RBC 4.46 3.87 - 5.11 MIL/uL   Hemoglobin 12.1 12.0 - 15.0 g/dL   HCT 31.5 94.5 - 85.9 %   MCV 83.4 80.0 - 100.0 fL   MCH 27.1 26.0 - 34.0 pg   MCHC 32.5 30.0 - 36.0 g/dL   RDW 29.2 44.6 - 28.6 %   Platelets 250 150 - 400 K/uL   nRBC 0.0 0.0 - 0.2 %   Neutrophils Relative % 85 %   Neutro  Abs 5.5 1.7 - 7.7 K/uL   Lymphocytes Relative 8 %   Lymphs Abs 0.5 (L) 0.7 - 4.0 K/uL   Monocytes Relative 6 %   Monocytes Absolute 0.4 0.1 - 1.0 K/uL   Eosinophils Relative 0 %   Eosinophils Absolute 0.0 0.0 - 0.5 K/uL   Basophils Relative 0 %   Basophils Absolute 0.0 0.0 - 0.1 K/uL   Immature Granulocytes 1 %   Abs Immature Granulocytes 0.03 0.00 - 0.07 K/uL    Comment: Performed at [facility], [address] [place], [place] [address]  Comprehensive metabolic panel     Status: Abnormal   Collection Time: 11/05/18 12:37 AM  Result Value Ref Range   Sodium 128 (L) 135 - 145 mmol/L   Potassium 5.3 (H) 3.5 - 5.1 mmol/L   Chloride 94 (L) 98 - 111 mmol/L   CO2 24 22 - 32 mmol/L   Glucose, Bld 292 (H) 70 - 99 mg/dL    BUN 15 8 - 23 mg/dL   Creatinine, Ser 6.640.60 0.44 - 1.00 mg/dL   Calcium 8.6 (L) 8.9 - 10.3 mg/dL   Total Protein 7.4 6.5 - 8.1 g/dL   Albumin 3.1 (L) 3.5 - 5.0 g/dL   AST 23 15 - 41 U/L   ALT 13 0 - 44 U/L   Alkaline Phosphatase 97 38 - 126 U/L   Total Bilirubin 0.6 0.3 - 1.2 mg/dL   GFR calc non Af Amer >60 >60 mL/min   GFR calc Af Amer >60 >60 mL/min   Anion gap 10 5 - 15    Comment: Performed at [facility], [address] [place], [place] [address]  Fibrinogen     Status: Abnormal   Collection Time: 11/05/18 12:37 AM  Result Value Ref Range   Fibrinogen 766 (H) 210 - 475 mg/dL    Comment: Performed at [facility], [address] [place], [place] [address]  Lactate dehydrogenase     Status: Abnormal   Collection Time: 11/05/18 12:37 AM  Result Value Ref Range   LDH 218 (H) 98 - 192 U/L    Comment: Performed at [facility], [address] [place], [place] [address]  SARS Coronavirus 2 [facility](Hospital order, Performed in [facility] hospital lab)     Status: Abnormal   Collection Time: 11/05/18 12:51 AM  Result Value Ref Range   SARS Coronavirus 2 POSITIVE (A) NEGATIVE    Comment: RESULT CALLED TO, READ BACK BY AND VERIFIED WITH: ALissa Merlin. Remi Rester,RN 0221 11/05/2018 T. TYSOR (NOTE) If result is NEGATIVE SARS-CoV-2 target nucleic acids are NOT DETECTED. The SARS-CoV-2 RNA is generally detectable in upper and lower  respiratory specimens during the acute phase of infection. The lowest  concentration of SARS-CoV-2 viral copies this assay can detect is 250  copies / mL. A negative result does not preclude SARS-CoV-2 infection  and should not be used as the sole basis for treatment or other  patient management decisions.  A negative result may occur with  improper specimen collection / handling, submission of specimen other  than nasopharyngeal swab, presence of viral mutation(s) within the  areas targeted by this assay, and inadequate number of viral copies  (<250  copies / mL). A negative result must be combined with clinical  observations, patient history, and epidemiological information. If result is POSITIVE SARS-CoV-2 target nucleic acids are DETECTED.  The SARS-CoV-2 RNA is generally detectable in upper and lower  respiratory specimens during  the acute phase of infection.  Positive  results are indicative of active infection with SARS-CoV-2.  Clinical  correlation with patient history and other diagnostic information is  necessary to determine patient infection status.  Positive results do  not rule out bacterial infection or co-infection with other viruses. If result is PRESUMPTIVE POSTIVE SARS-CoV-2 nucleic acids MAY BE PRESENT.   A presumptive positive result was obtained on the submitted specimen  and confirmed on repeat testing.  While 2019 novel coronavirus  (SARS-CoV-2) nucleic acids may be present in the submitted sample  additional confirmatory testing may be necessary for epidemiological  and / or clinical management purposes  to differentiate between  SARS-CoV-2 and other Sarbecovirus currently known to infect humans.  If clinically indicated additional testing with an alternate test  methodology [phone])  is advised. The SARS-CoV-2 RNA is generally  detectable in upper and lower respiratory specimens during the acute  phase of infection. The expected result is Negative. Fact Sheet for Patients:  BoilerBrush.com.cy Fact Sheet for Healthcare Providers: https://pope.com/ This test is not yet approved or cleared by the [place] FDA and has been authorized for detection and/or diagnosis of SARS-CoV-2 by FDA under an Emergency Use Authorization (EUA).  This EUA will remain in effect (meaning this test can be used) for the duration of the COVID-19 declaration under Section 564(b)(1) of the Act, 21 U.S.C. section 360bbb-3(b)(1), unless the authorization is terminated or revoked  sooner. Performed at [facility], [address], [place], [place] [address]    Dg Chest Port 1 View  Result Date: 11/05/2018 CLINICAL DATA:  Cough and fever EXAM: PORTABLE CHEST 1 VIEW COMPARISON:  05/03/2016 FINDINGS: There are diffuse hazy bilateral airspace opacities. No pneumothorax. No large pleural effusion. The cardiac silhouette is enlarged. Old healed right-sided rib fractures are noted. There is no acute osseous abnormality. IMPRESSION: Multifocal airspace opacities highly concerning for multifocal pneumonia (viral or bacterial). Borderline enlarged heart. Electronically Signed   By: Katherine Mantle M.D.   On: 11/05/2018 01:09    Pending Labs Unresulted Labs (From admission, onward)    Start     Ordered   11/12/18 0500  Creatinine, serum  (enoxaparin (LOVENOX)    CrCl >/= 30 ml/min)  Weekly,   R    Comments:  while on enoxaparin therapy    11/05/18 0324   11/06/18 0500  CBC with Differential/Platelet  Daily,   R     11/05/18 0324   11/06/18 0500  Comprehensive metabolic panel  Daily,   R     11/05/18 0324   11/06/18 0500  C-reactive protein  Daily,   R     11/05/18 0324   11/06/18 0500  D-dimer, quantitative (not at [facility])  Daily,   R     11/05/18 0324   11/06/18 0500  Interleukin-6, Plasma  Daily,   R     11/05/18 0324   11/05/18 0325  Culture, blood (Routine X 2) w Reflex to ID Panel  BLOOD CULTURE X 2,   R     11/05/18 0324   11/05/18 0324  C-reactive protein  Once,   R     11/05/18 0324   11/05/18 0324  Interleukin-6, Plasma  Once,   R     11/05/18 0324   11/05/18 0324  Procalcitonin  Once,   R     11/05/18 0324          Vitals/Pain Today's Vitals   11/05/18 0100 11/05/18 0200 11/05/18 0230  11/05/18 0300  BP: (!) 132/53 (!) 123/55 (!) 131/54 (!) 121/56  Pulse: 86 81 78 79  Resp: (!) 25 (!) 28 (!) 22 (!) 22  Temp:      TempSrc:      SpO2: 92% 93% 94% 95%  Weight:      PainSc:        Isolation Precautions Droplet and Contact  precautions  Medications Medications  insulin aspart (novoLOG) injection 0-9 Units (has no administration in time range)  insulin aspart (novoLOG) injection 0-5 Units (has no administration in time range)  enoxaparin (LOVENOX) injection 40 mg (has no administration in time range)  acetaminophen (TYLENOL) tablet 650 mg (has no administration in time range)  HYDROcodone-acetaminophen (NORCO/VICODIN) 5-325 MG per tablet 1 tablet (has no administration in time range)  zolpidem (AMBIEN) tablet 5 mg (has no administration in time range)  polyethylene glycol (MIRALAX / GLYCOLAX) packet 17 g (has no administration in time range)  bisacodyl (DULCOLAX) EC tablet 5 mg (has no administration in time range)  ondansetron (ZOFRAN) tablet 4 mg (has no administration in time range)    Or  ondansetron (ZOFRAN) injection 4 mg (has no administration in time range)    Mobility walks Low fall risk   Focused Assessments Pulmonary Assessment Handoff:  Lung sounds:   O2 Device: Nasal Cannula O2 Flow Rate (L/min): 2 L/min      R Recommendations: See Admitting Provider Note  Report given to:   Additional Notes: Pt is spanish speaking

## 2018-11-05 NOTE — ED Notes (Signed)
MD at bedside. 

## 2018-11-05 NOTE — Progress Notes (Signed)
MD paged to notify that pt has arrived to unit.

## 2018-11-05 NOTE — Progress Notes (Addendum)
PROGRESS NOTE    Makynli Stills  ZOX:[id number] DOB: February 09, 1946 DOA: 11/04/2018 PCP: Hoy Register, MD    Brief Narrative:  73 year old female with history of diabetes, hypertension, presented to the hospital with complaints of fevers, fatigue and generalized weakness.  Chest x-ray showed multifocal pneumonia and she was diagnosed with COVID-19.  She was admitted to [facility] where she was noted to have elevated inflammatory markers and was requiring supplemental oxygen.  She was started on intravenous steroids and received a dose of Actemra on 5/24.   Assessment & Plan:   Principal Problem:   Pneumonia due to COVID-19 virus Active Problems:   Acute respiratory failure with hypoxia (HCC)   Type 2 diabetes mellitus without complication, without long-term current use of insulin (HCC)   Essential hypertension   Hyponatremia   1. Acute respiratory failure with hypoxia secondary to COVID-19 pneumonia.  Chest x-ray shows multifocal airspace opacities concerning for viral pneumonia.  Patient's inflammatory markers are elevated and she is requiring supplemental oxygen at 2 L.  We will try and wean down oxygen as tolerated.  Start the patient on intravenous steroids as well as Actemra.  She does not have any contraindications for Actemra.  Continue to follow inflammatory markers.  She has been advised to participate in pulmonary hygiene with incentive spirometer and practice prone breathing as tolerated  The treatment plan and use of medications and known side effects were discussed with patient/family, they were clearly explained that there is no proven definitive treatment for COVID-19 infection, any medications used here are based on published clinical articles/anecdotal data which are not peer-reviewed or randomized control trials.  Complete risks and long-term side effects are unknown, however in the best clinical judgment they seem to be of some clinical benefit rather than  medical risks.  Patient/family agree with the treatment plan and want to receive the given medications.  2. Hyponatremia.  Possibly related to volume depletion and hyperglycemia.  Continue to follow. 3. Diabetes.  Blood sugars are currently stable.  Continue sliding scale insulin. 4. Hypertension.  Chronically on lisinopril.  Is currently on hold since she was hyperglycemic on admission.  We will continue to hold for now.  Blood pressures been stable.   DVT prophylaxis: Lovenox Code Status: Full code Family Communication: Discussed with grandson, Jesus, over the phone Disposition Plan: Pending hospital course, hopefully discharge home as medical condition improves   Consultants:     Procedures:     Antimicrobials:       Subjective: She feels weak and tired.  Denies any shortness of breath.  Objective: Vitals:   11/05/18 0654 11/05/18 0659 11/05/18 0800 11/05/18 1200  BP:   (!) 125/52 (!) 121/53  Pulse:  79 73 76  Resp:  (!) 26 18 (!) 21  Temp: 98.7 F (37.1 C)  98.8 F (37.1 C) 98.4 F (36.9 C)  TempSrc: Oral  Oral Oral  SpO2:  97% 97% 94%  Weight:  64 kg    Height:   (1.651 m)      Intake/Output Summary (Last 24 hours) at 11/05/2018 1506 Last data filed at 11/05/2018 0655 Gross per 24 hour  Intake -  Output 600 ml  Net -600 ml   Filed Weights   11/05/18 0053 11/05/18 0659  Weight: 64 kg 64 kg    Examination:  General exam: Appears calm and comfortable  Respiratory system: Scattered rhonchi. Respiratory effort normal. Cardiovascular system: S1 & S2 heard, RRR. No JVD, murmurs, rubs, gallops or  clicks. No pedal edema. Gastrointestinal system: Abdomen is nondistended, soft and nontender. No organomegaly or masses felt. Normal bowel sounds heard. Central nervous system: Alert and oriented. No focal neurological deficits. Extremities: Symmetric 5 x 5 power. Skin: No rashes, lesions or ulcers Psychiatry: Judgement and insight appear normal. Mood &  affect appropriate.     Data Reviewed: I have personally reviewed following labs and imaging studies  CBC: Recent Labs  Lab 11/05/18 0037  WBC 6.4  NEUTROABS 5.5  HGB 12.1  HCT 37.2  MCV 83.4  PLT 250   Basic Metabolic Panel: Recent Labs  Lab 11/05/18 0037  NA 128*  K 5.3*  CL 94*  CO2 24  GLUCOSE 292*  BUN 15  CREATININE 0.60  CALCIUM 8.6*   GFR: Estimated Creatinine Clearance: 54.7 mL/min (by C-G formula based on SCr of 0.6 mg/dL). Liver Function Tests: Recent Labs  Lab 11/05/18 0037  AST 23  ALT 13  ALKPHOS 97  BILITOT 0.6  PROT 7.4  ALBUMIN 3.1*   No results for input(s): LIPASE, AMYLASE in the last 168 hours. No results for input(s): AMMONIA in the last 168 hours. Coagulation Profile: No results for input(s): INR, PROTIME in the last 168 hours. Cardiac Enzymes: No results for input(s): CKTOTAL, CKMB, CKMBINDEX, TROPONINI in the last 168 hours. BNP (last 3 results) No results for input(s): PROBNP in the last 8760 hours. HbA1C: No results for input(s): HGBA1C in the last 72 hours. CBG: Recent Labs  Lab 11/05/18 0811 11/05/18 1234  GLUCAP 100* 116*   Lipid Profile: No results for input(s): CHOL, HDL, LDLCALC, TRIG, CHOLHDL, LDLDIRECT in the last 72 hours. Thyroid Function Tests: No results for input(s): TSH, T4TOTAL, FREET4, T3FREE, THYROIDAB in the last 72 hours. Anemia Panel: No results for input(s): VITAMINB12, FOLATE, FERRITIN, TIBC, IRON, RETICCTPCT in the last 72 hours. Sepsis Labs: Recent Labs  Lab 11/05/18 0422  PROCALCITON <0.10    Recent Results (from the past 240 hour(s))  SARS Coronavirus 2 [facility] order, Performed in [facility] hospital lab)     Status: Abnormal   Collection Time: 11/05/18 12:51 AM  Result Value Ref Range Status   SARS Coronavirus 2 POSITIVE (A) NEGATIVE Final    Comment: RESULT CALLED TO, READ BACK BY AND VERIFIED WITH: ALissa Merlin 0221 11/05/2018 T. TYSOR (NOTE) If result is NEGATIVE SARS-CoV-2  target nucleic acids are NOT DETECTED. The SARS-CoV-2 RNA is generally detectable in upper and lower  respiratory specimens during the acute phase of infection. The lowest  concentration of SARS-CoV-2 viral copies this assay can detect is 250  copies / mL. A negative result does not preclude SARS-CoV-2 infection  and should not be used as the sole basis for treatment or other  patient management decisions.  A negative result may occur with  improper specimen collection / handling, submission of specimen other  than nasopharyngeal swab, presence of viral mutation(s) within the  areas targeted by this assay, and inadequate number of viral copies  (<250 copies / mL). A negative result must be combined with clinical  observations, patient history, and epidemiological information. If result is POSITIVE SARS-CoV-2 target nucleic acids are DETECTED.  The SARS-CoV-2 RNA is generally detectable in upper and lower  respiratory specimens during the acute phase of infection.  Positive  results are indicative of active infection with SARS-CoV-2.  Clinical  correlation with patient history and other diagnostic information is  necessary to determine patient infection status.  Positive results do  not rule out bacterial  infection or co-infection with other viruses. If result is PRESUMPTIVE POSTIVE SARS-CoV-2 nucleic acids MAY BE PRESENT.   A presumptive positive result was obtained on the submitted specimen  and confirmed on repeat testing.  While 2019 novel coronavirus  (SARS-CoV-2) nucleic acids may be present in the submitted sample  additional confirmatory testing may be necessary for epidemiological  and / or clinical management purposes  to differentiate between  SARS-CoV-2 and other Sarbecovirus currently known to infect humans.  If clinically indicated additional testing with an alternate test  methodology [phone]([id number])  is advised. The SARS-CoV-2 RNA is generally  detectable in upper and lower  respiratory specimens during the acute  phase of infection. The expected result is Negative. Fact Sheet for Patients:  BoilerBrush.com.cyhttps://www.fda.gov/media/136312/download Fact Sheet for Healthcare Providers: https://pope.com/https://www.fda.gov/media/136313/download This test is not yet approved or cleared by the [place] FDA and has been authorized for detection and/or diagnosis of SARS-CoV-2 by FDA under an Emergency Use Authorization (EUA).  This EUA will remain in effect (meaning this test can be used) for the duration of the COVID-19 declaration under Section 564(b)(1) of the Act, 21 U.S.C. section 360bbb-3(b)(1), unless the authorization is terminated or revoked sooner. Performed at [facility], [address] [place], [place] [address]          Radiology Studies: Dg Chest Port 1 View  Result Date: 11/05/2018 CLINICAL DATA:  Cough and fever EXAM: PORTABLE CHEST 1 VIEW COMPARISON:  05/03/2016 FINDINGS: There are diffuse hazy bilateral airspace opacities. No pneumothorax. No large pleural effusion. The cardiac silhouette is enlarged. Old healed right-sided rib fractures are noted. There is no acute osseous abnormality. IMPRESSION: Multifocal airspace opacities highly concerning for multifocal pneumonia (viral or bacterial). Borderline enlarged heart. Electronically Signed   By: Katherine Mantlehristopher  Green M.D.   On: 11/05/2018 01:09        Scheduled Meds: . enoxaparin (LOVENOX) injection  40 mg Subcutaneous Q24H  . insulin aspart  0-5 Units Subcutaneous QHS  . insulin aspart  0-9 Units Subcutaneous TID WC  . methylPREDNISolone (SOLU-MEDROL) injection  40 mg Intravenous Q12H   Continuous Infusions:   LOS: 0 days    Time spent: 35mins.  Time spent speaking to patient, explaining risks and benefits of medications, speaking to family members over the phone, coordinating care    Erick BlinksJehanzeb Memon, MD [facility]   If 7PM-7AM, please contact night-coverage [email or web address]  11/05/2018,  3:06 PM

## 2018-11-06 DIAGNOSIS — J9601 Acute respiratory failure with hypoxia: Secondary | ICD-10-CM | POA: Diagnosis not present

## 2018-11-06 DIAGNOSIS — E871 Hypo-osmolality and hyponatremia: Secondary | ICD-10-CM | POA: Diagnosis not present

## 2018-11-06 DIAGNOSIS — U071 COVID-19: Secondary | ICD-10-CM | POA: Diagnosis not present

## 2018-11-06 DIAGNOSIS — I10 Essential (primary) hypertension: Secondary | ICD-10-CM | POA: Diagnosis not present

## 2018-11-06 LAB — CBC WITH DIFFERENTIAL/PLATELET
Abs Immature Granulocytes: 0.03 10*3/uL (ref 0.00–0.07)
Basophils Absolute: 0 10*3/uL (ref 0.0–0.1)
Basophils Relative: 0 %
Eosinophils Absolute: 0 10*3/uL (ref 0.0–0.5)
Eosinophils Relative: 0 %
HCT: 40.3 % (ref 36.0–46.0)
Hemoglobin: 12.8 g/dL (ref 12.0–15.0)
Immature Granulocytes: 1 %
Lymphocytes Relative: 16 %
Lymphs Abs: 0.6 10*3/uL — ABNORMAL LOW (ref 0.7–4.0)
MCH: 27.4 pg (ref 26.0–34.0)
MCHC: 31.8 g/dL (ref 30.0–36.0)
MCV: 86.1 fL (ref 80.0–100.0)
Monocytes Absolute: 0.1 10*3/uL (ref 0.1–1.0)
Monocytes Relative: 3 %
Neutro Abs: 3.1 10*3/uL (ref 1.7–7.7)
Neutrophils Relative %: 80 %
Platelets: 295 10*3/uL (ref 150–400)
RBC: 4.68 MIL/uL (ref 3.87–5.11)
RDW: 13.2 % (ref 11.5–15.5)
WBC: 3.8 10*3/uL — ABNORMAL LOW (ref 4.0–10.5)
nRBC: 0 % (ref 0.0–0.2)

## 2018-11-06 LAB — FERRITIN: Ferritin: 303 ng/mL (ref 11–307)

## 2018-11-06 LAB — GLUCOSE, CAPILLARY
Glucose-Capillary: 300 mg/dL — ABNORMAL HIGH (ref 70–99)
Glucose-Capillary: 393 mg/dL — ABNORMAL HIGH (ref 70–99)
Glucose-Capillary: 516 mg/dL (ref 70–99)
Glucose-Capillary: 428 mg/dL — ABNORMAL HIGH (ref 70–99)

## 2018-11-06 LAB — COMPREHENSIVE METABOLIC PANEL
ALT: 14 U/L (ref 0–44)
AST: 22 U/L (ref 15–41)
Albumin: 3.1 g/dL — ABNORMAL LOW (ref 3.5–5.0)
Alkaline Phosphatase: 94 U/L (ref 38–126)
Anion gap: 10 (ref 5–15)
BUN: 16 mg/dL (ref 8–23)
CO2: 28 mmol/L (ref 22–32)
Calcium: 8.9 mg/dL (ref 8.9–10.3)
Chloride: 101 mmol/L (ref 98–111)
Creatinine, Ser: 0.52 mg/dL (ref 0.44–1.00)
GFR calc Af Amer: >60 mL/min (ref >60)
GFR calc non Af Amer: >60 mL/min (ref >60)
Glucose, Bld: 260 mg/dL — ABNORMAL HIGH (ref 70–99)
Potassium: 4.5 mmol/L (ref 3.5–5.1)
Sodium: 139 mmol/L (ref 135–145)
Total Bilirubin: 0.1 mg/dL — ABNORMAL LOW (ref 0.3–1.2)
Total Protein: 7.5 g/dL (ref 6.5–8.1)

## 2018-11-06 LAB — INTERLEUKIN-6, PLASMA: Interleukin-6, Plasma: 134.3 pg/mL — ABNORMAL HIGH (ref 0.0–12.2)

## 2018-11-06 LAB — D-DIMER, QUANTITATIVE: D-Dimer, Quant: 0.8 ug/mL-FEU — ABNORMAL HIGH (ref 0.00–0.50)

## 2018-11-06 LAB — C-REACTIVE PROTEIN: CRP: 19.5 mg/dL — ABNORMAL HIGH (ref <1.0)

## 2018-11-06 MED ORDER — INSULIN GLARGINE 100 UNIT/ML ~~LOC~~ SOLN
15.0000 [IU] | Freq: Every day | SUBCUTANEOUS | Status: DC
  Administered 2018-11-06 – 2018-11-07 (×2): 15 [IU] via SUBCUTANEOUS
  Filled 2018-11-06 (×2): qty 0.15

## 2018-11-06 MED ORDER — INSULIN ASPART 100 UNIT/ML ~~LOC~~ SOLN: 0.0000 [IU] | Freq: Every day | SUBCUTANEOUS | Status: DC

## 2018-11-06 MED ORDER — INSULIN ASPART 100 UNIT/ML ~~LOC~~ SOLN
0.0000 [IU] | Freq: Three times a day (TID) | SUBCUTANEOUS | Status: DC
  Administered 2018-11-07: 13:00:00 7 [IU] via SUBCUTANEOUS
  Administered 2018-11-07: 09:00:00 3 [IU] via SUBCUTANEOUS

## 2018-11-06 MED ORDER — INSULIN ASPART 100 UNIT/ML ~~LOC~~ SOLN
20.0000 [IU] | Freq: Once | SUBCUTANEOUS | Status: AC
  Administered 2018-11-06: 20 [IU] via SUBCUTANEOUS

## 2018-11-06 MED ORDER — DM-GUAIFENESIN ER 30-600 MG PO TB12
1.0000 | ORAL_TABLET | Freq: Two times a day (BID) | ORAL | Status: DC
  Administered 2018-11-06 – 2018-11-07 (×2): 1 via ORAL
  Filled 2018-11-06 (×4): qty 1

## 2018-11-06 NOTE — Progress Notes (Signed)
Called patient's daughter, Byrd Hesselbach, with update.  Interpreter services used.  No questions from family.  Patient spoke with family.

## 2018-11-06 NOTE — Progress Notes (Signed)
Pt ambulated in room from window to door x 3, Sats decreased to 85%, assisted back to bed and placed on 2L [place] sats increased to 93%. Pt resting comfortably.

## 2018-11-06 NOTE — Progress Notes (Signed)
PROGRESS NOTE    Kalimah Brannick  MPN:[id number] DOB: [birth year] DOA: 11/04/2018 PCP: Hoy Register, MD    Brief Narrative:  73 year old female with history of diabetes, hypertension, presented to the hospital with complaints of fevers, fatigue and generalized weakness.  Chest x-ray showed multifocal pneumonia and she was diagnosed with COVID-19.  She was admitted to [facility] where she was noted to have elevated inflammatory markers and was requiring supplemental oxygen.  She was started on intravenous steroids and received a dose of Actemra on 5/24.   Assessment & Plan:   Principal Problem:   Pneumonia due to COVID-19 virus Active Problems:   Acute respiratory failure with hypoxia (HCC)   Type 2 diabetes mellitus without complication, without long-term current use of insulin (HCC)   Essential hypertension   Hyponatremia   1. Acute respiratory failure with hypoxia secondary to COVID-19 pneumonia.  Chest x-ray shows multifocal airspace opacities concerning for viral pneumonia.  Patient's inflammatory markers are elevated and she is requiring supplemental oxygen at 2 L.  We will try and wean down oxygen as tolerated.  Patient has been receiving intravenous steroids and received a dose of Actemra on 5/24.  Overall respiratory status appears to be improving.  Continue to follow inflammatory markers.  She has been advised to participate in pulmonary hygiene with incentive spirometer and practice prone breathing as tolerated  The treatment plan and use of medications and known side effects were discussed with patient/family, they were clearly explained that there is no proven definitive treatment for COVID-19 infection, any medications used here are based on published clinical articles/anecdotal data which are not peer-reviewed or randomized control trials.  Complete risks and long-term side effects are unknown, however in the best clinical judgment they seem to be of some  clinical benefit rather than medical risks.  Patient/family agree with the treatment plan and want to receive the given medications.  2. Hyponatremia.  Improved with IV fluids. 3. Diabetes.  Blood sugars are elevated secondary to steroids.  Change sliding scale to resistant coverage add basal insulin.. 4. Hypertension.  Chronically on lisinopril.  Is currently on hold since she was hyperkalemic on admission.  We will continue to hold for now.  Blood pressures been stable.   DVT prophylaxis: Lovenox Code Status: Full code Family Communication: Discussed with grandson, Jesus, over the phone 5/25 Disposition Plan: Pending hospital course, hopefully discharge home as medical condition improves, possibly in a.m.   Consultants:     Procedures:     Antimicrobials:       Subjective: She feels weak and tired.  Denies any shortness of breath.  Objective: Vitals:   11/06/18 0000 11/06/18 0313 11/06/18 0324 11/06/18 1254  BP: (!) 146/70  140/67   Pulse: (!) 58 79    Resp: 15 18    Temp:  97.7 F (36.5 C)  97.6 F (36.4 C)  TempSrc:  Oral    SpO2: 100% 100%    Weight:      Height:        Intake/Output Summary (Last 24 hours) at 11/06/2018 1705 Last data filed at 11/06/2018 1440 Gross per 24 hour  Intake -  Output 3100 ml  Net -3100 ml   Filed Weights   11/05/18 0053 11/05/18 0659  Weight: 64 kg 64 kg    Examination:  General exam: Appears calm and comfortable  Respiratory system: Scattered rhonchi. Respiratory effort normal. Cardiovascular system: S1 & S2 heard, RRR. No JVD, murmurs, rubs, gallops or clicks. No  pedal edema. Gastrointestinal system: Abdomen is nondistended, soft and nontender. No organomegaly or masses felt. Normal bowel sounds heard. Central nervous system: Alert and oriented. No focal neurological deficits. Extremities: Symmetric 5 x 5 power. Skin: No rashes, lesions or ulcers Psychiatry: Judgement and insight appear normal. Mood & affect  appropriate.     Data Reviewed: I have personally reviewed following labs and imaging studies  CBC: Recent Labs  Lab 11/05/18 0037 11/06/18 0323  WBC 6.4 3.8*  NEUTROABS 5.5 3.1  HGB 12.1 12.8  HCT 37.2 40.3  MCV 83.4 86.1  PLT 250 295   Basic Metabolic Panel: Recent Labs  Lab 11/05/18 0037 11/06/18 0323  NA 128* 139  K 5.3* 4.5  CL 94* 101  CO2 24 28  GLUCOSE 292* 260*  BUN 15 16  CREATININE 0.60 0.52  CALCIUM 8.6* 8.9   GFR: Estimated Creatinine Clearance: 54.7 mL/min (by C-G formula based on SCr of 0.52 mg/dL). Liver Function Tests: Recent Labs  Lab 11/05/18 0037 11/06/18 0323  AST 23 22  ALT 13 14  ALKPHOS 97 94  BILITOT 0.6 0.1*  PROT 7.4 7.5  ALBUMIN 3.1* 3.1*   No results for input(s): LIPASE, AMYLASE in the last 168 hours. No results for input(s): AMMONIA in the last 168 hours. Coagulation Profile: No results for input(s): INR, PROTIME in the last 168 hours. Cardiac Enzymes: No results for input(s): CKTOTAL, CKMB, CKMBINDEX, TROPONINI in the last 168 hours. BNP (last 3 results) No results for input(s): PROBNP in the last 8760 hours. HbA1C: No results for input(s): HGBA1C in the last 72 hours. CBG: Recent Labs  Lab 11/05/18 1234 11/05/18 1621 11/05/18 2133 11/06/18 0753 11/06/18 1217  GLUCAP 116* 251* 385* 300* 393*   Lipid Profile: No results for input(s): CHOL, HDL, LDLCALC, TRIG, CHOLHDL, LDLDIRECT in the last 72 hours. Thyroid Function Tests: No results for input(s): TSH, T4TOTAL, FREET4, T3FREE, THYROIDAB in the last 72 hours. Anemia Panel: Recent Labs    11/06/18 0255  FERRITIN 303   Sepsis Labs: Recent Labs  Lab 11/05/18 0422  PROCALCITON <0.10    Recent Results (from the past 240 hour(s))  SARS Coronavirus 2 [facility](Hospital order, Performed in [facility] hospital lab)     Status: Abnormal   Collection Time: 11/05/18 12:51 AM  Result Value Ref Range Status   SARS Coronavirus 2 POSITIVE (A) NEGATIVE Final    Comment:  RESULT CALLED TO, READ BACK BY AND VERIFIED WITH: ALissa Merlin. GUZMAN,RN 0221 11/05/2018 T. TYSOR (NOTE) If result is NEGATIVE SARS-CoV-2 target nucleic acids are NOT DETECTED. The SARS-CoV-2 RNA is generally detectable in upper and lower  respiratory specimens during the acute phase of infection. The lowest  concentration of SARS-CoV-2 viral copies this assay can detect is 250  copies / mL. A negative result does not preclude SARS-CoV-2 infection  and should not be used as the sole basis for treatment or other  patient management decisions.  A negative result may occur with  improper specimen collection / handling, submission of specimen other  than nasopharyngeal swab, presence of viral mutation(s) within the  areas targeted by this assay, and inadequate number of viral copies  (<250 copies / mL). A negative result must be combined with clinical  observations, patient history, and epidemiological information. If result is POSITIVE SARS-CoV-2 target nucleic acids are DETECTED.  The SARS-CoV-2 RNA is generally detectable in upper and lower  respiratory specimens during the acute phase of infection.  Positive  results are indicative of active infection  with SARS-CoV-2.  Clinical  correlation with patient history and other diagnostic information is  necessary to determine patient infection status.  Positive results do  not rule out bacterial infection or co-infection with other viruses. If result is PRESUMPTIVE POSTIVE SARS-CoV-2 nucleic acids MAY BE PRESENT.   A presumptive positive result was obtained on the submitted specimen  and confirmed on repeat testing.  While 2019 novel coronavirus  (SARS-CoV-2) nucleic acids may be present in the submitted sample  additional confirmatory testing may be necessary for epidemiological  and / or clinical management purposes  to differentiate between  SARS-CoV-2 and other Sarbecovirus currently known to infect humans.  If clinically indicated additional  testing with an alternate test  methodology [phone])  is advised. The SARS-CoV-2 RNA is generally  detectable in upper and lower respiratory specimens during the acute  phase of infection. The expected result is Negative. Fact Sheet for Patients:  BoilerBrush.com.cy Fact Sheet for Healthcare Providers: https://pope.com/ This test is not yet approved or cleared by the [place] FDA and has been authorized for detection and/or diagnosis of SARS-CoV-2 by FDA under an Emergency Use Authorization (EUA).  This EUA will remain in effect (meaning this test can be used) for the duration of the COVID-19 declaration under Section 564(b)(1) of the Act, 21 U.S.C. section 360bbb-3(b)(1), unless the authorization is terminated or revoked sooner. Performed at [facility], [address] [place], [place] [address]   Culture, blood (Routine X 2) w Reflex to ID Panel     Status: None (Preliminary result)   Collection Time: 11/05/18  4:20 AM  Result Value Ref Range Status   Specimen Description BLOOD LEFT HAND  Final   Special Requests   Final    BOTTLES DRAWN AEROBIC AND ANAEROBIC Blood Culture adequate volume   Culture   Final    NO GROWTH 1 DAY Performed at [facility], [address] [place], [place] [address]    Report Status PENDING  Incomplete  Culture, blood (Routine X 2) w Reflex to ID Panel     Status: None (Preliminary result)   Collection Time: 11/05/18  4:22 AM  Result Value Ref Range Status   Specimen Description BLOOD RIGHT ARM  Final   Special Requests   Final    BOTTLES DRAWN AEROBIC AND ANAEROBIC Blood Culture adequate volume   Culture   Final    NO GROWTH 1 DAY Performed at [facility], [address], [place], [place] [address]    Report Status PENDING  Incomplete         Radiology Studies: Dg Chest Port 1 View  Result Date: 11/05/2018 CLINICAL DATA:  Cough and fever EXAM: PORTABLE CHEST 1  VIEW COMPARISON:  05/03/2016 FINDINGS: There are diffuse hazy bilateral airspace opacities. No pneumothorax. No large pleural effusion. The cardiac silhouette is enlarged. Old healed right-sided rib fractures are noted. There is no acute osseous abnormality. IMPRESSION: Multifocal airspace opacities highly concerning for multifocal pneumonia (viral or bacterial). Borderline enlarged heart. Electronically Signed   By: Katherine Mantle M.D.   On: 11/05/2018 01:09        Scheduled Meds: . dextromethorphan-guaiFENesin  1 tablet Oral BID  . enoxaparin (LOVENOX) injection  40 mg Subcutaneous Q24H  . [START ON 11/07/2018] insulin aspart  0-20 Units Subcutaneous TID WC  . insulin aspart  0-5 Units Subcutaneous QHS  . insulin glargine  15 Units Subcutaneous Daily  . methylPREDNISolone (SOLU-MEDROL) injection  40 mg Intravenous Q12H  Continuous Infusions:   LOS: 1 day    Time spent: .  Time spent speaking to patient, explaining risks and benefits of medications, speaking to family members over the phone, coordinating care    Erick Blinks, MD [facility]   If 7PM-7AM, please contact night-coverage [email or web address]  11/06/2018, 5:05 PM

## 2018-11-06 NOTE — Progress Notes (Signed)
CardioVascular Research Department and AHF Team  ReDS Research Project   Patient #: [id number]  ReDS Measurement  Right: 53 %  Left: low quality x 3

## 2018-11-07 DIAGNOSIS — E119 Type 2 diabetes mellitus without complications: Secondary | ICD-10-CM | POA: Diagnosis not present

## 2018-11-07 DIAGNOSIS — U071 COVID-19: Secondary | ICD-10-CM | POA: Diagnosis not present

## 2018-11-07 DIAGNOSIS — J9601 Acute respiratory failure with hypoxia: Secondary | ICD-10-CM | POA: Diagnosis not present

## 2018-11-07 DIAGNOSIS — I10 Essential (primary) hypertension: Secondary | ICD-10-CM | POA: Diagnosis not present

## 2018-11-07 LAB — CBC WITH DIFFERENTIAL/PLATELET
Abs Immature Granulocytes: 0.07 10*3/uL (ref 0.00–0.07)
Basophils Absolute: 0 10*3/uL (ref 0.0–0.1)
Basophils Relative: 0 %
Eosinophils Absolute: 0 10*3/uL (ref 0.0–0.5)
Eosinophils Relative: 0 %
HCT: 38.2 % (ref 36.0–46.0)
Hemoglobin: 12.4 g/dL (ref 12.0–15.0)
Immature Granulocytes: 1 %
Lymphocytes Relative: 9 %
Lymphs Abs: 0.8 10*3/uL (ref 0.7–4.0)
MCH: 27.3 pg (ref 26.0–34.0)
MCHC: 32.5 g/dL (ref 30.0–36.0)
MCV: 84.1 fL (ref 80.0–100.0)
Monocytes Absolute: 0.4 10*3/uL (ref 0.1–1.0)
Monocytes Relative: 5 %
Neutro Abs: 8 10*3/uL — ABNORMAL HIGH (ref 1.7–7.7)
Neutrophils Relative %: 85 %
Platelets: 350 10*3/uL (ref 150–400)
RBC: 4.54 MIL/uL (ref 3.87–5.11)
RDW: 13.1 % (ref 11.5–15.5)
WBC: 9.3 10*3/uL (ref 4.0–10.5)
nRBC: 0 % (ref 0.0–0.2)

## 2018-11-07 LAB — COMPREHENSIVE METABOLIC PANEL
ALT: 12 U/L (ref 0–44)
AST: 20 U/L (ref 15–41)
Albumin: 3 g/dL — ABNORMAL LOW (ref 3.5–5.0)
Alkaline Phosphatase: 95 U/L (ref 38–126)
Anion gap: 7 (ref 5–15)
BUN: 28 mg/dL — ABNORMAL HIGH (ref 8–23)
CO2: 28 mmol/L (ref 22–32)
Calcium: 9.1 mg/dL (ref 8.9–10.3)
Chloride: 104 mmol/L (ref 98–111)
Creatinine, Ser: 0.47 mg/dL (ref 0.44–1.00)
GFR calc Af Amer: >60 mL/min (ref >60)
GFR calc non Af Amer: >60 mL/min (ref >60)
Glucose, Bld: 35 mg/dL — CL (ref 70–99)
Potassium: 4.2 mmol/L (ref 3.5–5.1)
Sodium: 139 mmol/L (ref 135–145)
Total Bilirubin: 0.2 mg/dL — ABNORMAL LOW (ref 0.3–1.2)
Total Protein: 7.2 g/dL (ref 6.5–8.1)

## 2018-11-07 LAB — C-REACTIVE PROTEIN: CRP: 8.4 mg/dL — ABNORMAL HIGH (ref <1.0)

## 2018-11-07 LAB — D-DIMER, QUANTITATIVE: D-Dimer, Quant: 0.71 ug/mL-FEU — ABNORMAL HIGH (ref 0.00–0.50)

## 2018-11-07 LAB — GLUCOSE, CAPILLARY
Glucose-Capillary: 145 mg/dL — ABNORMAL HIGH (ref 70–99)
Glucose-Capillary: 247 mg/dL — ABNORMAL HIGH (ref 70–99)
Glucose-Capillary: 381 mg/dL — ABNORMAL HIGH (ref 70–99)

## 2018-11-07 LAB — INTERLEUKIN-6, PLASMA: Interleukin-6, Plasma: 72.5 pg/mL — ABNORMAL HIGH (ref 0.0–12.2)

## 2018-11-07 MED ORDER — ALBUTEROL SULFATE HFA 108 (90 BASE) MCG/ACT IN AERS: 2.0000 | INHALATION_SPRAY | Freq: Four times a day (QID) | RESPIRATORY_TRACT | 2 refills | Status: AC | PRN

## 2018-11-07 MED ORDER — INSULIN ASPART 100 UNIT/ML ~~LOC~~ SOLN
20.0000 [IU] | Freq: Once | SUBCUTANEOUS | Status: AC
  Administered 2018-11-07: 02:00:00 20 [IU] via SUBCUTANEOUS

## 2018-11-07 MED ORDER — DM-GUAIFENESIN ER 30-600 MG PO TB12: 1.0000 | ORAL_TABLET | Freq: Two times a day (BID) | ORAL | 0 refills | Status: AC

## 2018-11-07 NOTE — Progress Notes (Signed)
Patient blood glucose at 516; RN gave 20 units novolog per MD, rechecked glucose 2hrs post admin pt glucose at 428. RN notified MD; MD ordered additional 20 units novolog. PT glucose dropped to 35 per morning labs; MD notified. MD suggests pt eat and drink and recheck glucose in 30 min. RN gave patient juice and snack and will update day shift RN to recheck glucose at 0730 and page MD with results as requested.

## 2018-11-07 NOTE — Progress Notes (Signed)
Provided family update via telephone.

## 2018-11-07 NOTE — Progress Notes (Signed)
Discharge instructions called to jesus patients granson over the phone. Pt to transport by [facility]  D. Susann Givens RN

## 2018-11-07 NOTE — Progress Notes (Signed)
Inpatient Diabetes Program Recommendations  AACE/ADA: New Consensus Statement on Inpatient Glycemic Control (2015)  Target Ranges:  Prepandial:   less than 140 mg/dL      Peak postprandial:   less than 180 mg/dL (1-2 hours)      Critically ill patients:  140 - 180 mg/dL   Results for Ashlee Rivera, Ashlee Rivera (MRN [id number]) as of 11/07/2018 14:26  Ref. Range 11/06/2018 07:53 11/06/2018 12:17 11/06/2018 20:39 11/06/2018 23:16 11/07/2018 07:38 11/07/2018 11:54  Glucose-Capillary Latest Ref Range: 70 - 99 mg/dL 532 (H) 992 (H) 426 (HH) 428 (H) 145 (H) 247 (H)    Review of Glycemic Control  Diabetes history: DM 2 Outpatient Diabetes medications:  Current orders for Inpatient glycemic control:  Lantus 15 units Daily Novolog 0-20 units tid Novolog 0-5 units qhs  IV Solumedrol 40 mg Q12  Inpatient Diabetes Program Recommendations:    Pt had hyperglycemia yesterday into the 500's. Noted Lantus started yesterday. Fasting glucose 145 this am, however glucose trends increase after meals.  Consider Novolog 5 units tid meal coverage if patient consumes at least 50% of meals.  Thanks,  Christena Deem RN, MSN, BC-ADM Inpatient Diabetes Coordinator Team Pager [phone] (8a-5p)

## 2018-11-07 NOTE — Progress Notes (Signed)
Used [employer] [phone] to contact daughter, Ashlee Rivera regarding discharge.  No answer.  Voicemail left with instructions for daughter to return call, needing to coordinate home oxygen delivery and discharge transportation.

## 2018-11-07 NOTE — Progress Notes (Signed)
SATURATION QUALIFICATIONS: (This note is used to comply with regulatory documentation for home oxygen)  Patient Saturations on Room Air at Rest = 90%  Patient Saturations on Room Air while Ambulating = 84%  Patient Saturations on 2 Liters of oxygen while Ambulating = 94%

## 2018-11-07 NOTE — TOC Transition Note (Signed)
Transition of Care [facility]) - CM/SW Discharge Note   Patient Details  Name: Ashlee Rivera MRN: [id number] Date of Birth: 1945-09-04  Transition of [facility] CM/SW Contact:  Durenda Guthrie, RN Phone Number:.[phone](working remotely)  , 4:21 PM   Clinical Narrative:   73 yr old female-  admitted for treatment for COVID 19. Patient is non-english speaking. Speaks spanish. Case manager is working with interpreter to reach patient's daughter- Ashlee Rivera [phone] to discuss discharge, she is not answering phone. No other contact number available in chart.  1715: Case manager spoke with patient's grandson, Ashlee Rivera, They are at the home, unable to pickup patient, request [facility]. Case Manager confirmed with Verlon Au that oxygen is being delivered, it is five minutes away. CM called [facility] at [phone] to arrange transport, They have several transports ahead of patient. Notified Bedside RN.    Final next level of care: Home/Self Care Barriers to Discharge: No Barriers Identified   Patient Goals and CMS Choice     Choice offered to / list presented to : Adult Children  Discharge Placement                       Discharge Plan and Services   Discharge Planning Services: CM Consult Post Acute Care Choice: Durable Medical Equipment          DME Arranged: Oxygen DME Agency: [facility] Date DME Agency Contacted: 11/07/18 Time DME Agency Contacted: 1600 Representative spoke with at DME Agency: Durward Fortes [phone] [facility] Arranged: NA          Social Determinants of Health (SDOH) Interventions     Readmission Risk Interventions No flowsheet data found.

## 2018-11-07 NOTE — Discharge Summary (Signed)
Physician Discharge Summary  Ashlee Rivera HYQ:[id number] DOB: 1945-07-21 DOA: 11/04/2018  PCP: Hoy Register, MD  Admit date: 11/04/2018 Discharge date: 11/07/2018  Admitted From: home Disposition:  home  Recommendations for Outpatient Follow-up:  1. Follow up with PCP in 1-2 weeks 2. Please obtain BMP/CBC in one week  Home Health: Equipment/Devices:oxygen at 2L via [place]  Discharge Condition:stable CODE STATUS:full code Diet recommendation: heart healthy, carb mod  Brief/Interim Summary: 73 year old female with a history of diabetes, hypertension, admitted with fatigue, generalized weakness and fevers.  Chest x-ray showed multifocal pneumonia and she was diagnosed with COVID-19.  She was admitted to [facility] where she was noted to have elevated inflammatory markers and was requiring supplemental oxygen.  She was treated with intravenous steroids and received a dose of Actemra on 5/24.  Clinically, her respiratory status has improved.  She is no longer feeling short of breath.  She does still become mildly hypoxic on ambulation, but this improved with supplemental oxygen.  She will be discharged home on 2 L of oxygen.  She has completed her steroids in the hospital.  She was noted to have hyperglycemia secondary to steroids, but anticipate this should improve as steroids have been tapered off.  The remainder of medical problems remained stable.  Discharge Diagnoses:  Principal Problem:   Pneumonia due to COVID-19 virus Active Problems:   Acute respiratory failure with hypoxia (HCC)   Type 2 diabetes mellitus without complication, without long-term current use of insulin (HCC)   Essential hypertension   Hyponatremia    Discharge Instructions  Discharge Instructions    Diet - low sodium heart healthy   Complete by:  As directed    Increase activity slowly   Complete by:  As directed      Allergies as of 11/07/2018   No Known Allergies     Medication List     STOP taking these medications   lisinopril 2.5 MG tablet Commonly known as:  ZESTRIL     TAKE these medications   albuterol 108 (90 Base) MCG/ACT inhaler Commonly known as:  VENTOLIN HFA Inhale 2 puffs into the lungs every 6 (six) hours as needed for wheezing or shortness of breath.   ASPIR-LOW PO Take 150 mg by mouth daily.   benzonatate 200 MG capsule Commonly known as:  TESSALON Take 1 capsule (200 mg total) by mouth 3 (three) times daily as needed for cough.   dextromethorphan-guaiFENesin 30-600 MG 12hr tablet Commonly known as:  MUCINEX DM Take 1 tablet by mouth 2 (two) times daily.   glyBURIDE 5 MG tablet Commonly known as:  DIABETA Take 1 tablet (5 mg total) by mouth 2 (two) times daily with a meal.   ipratropium 0.06 % nasal spray Commonly known as:  Atrovent Place 2 sprays into both nostrils 4 (four) times daily. 3-4 times/ day   metFORMIN 850 MG tablet Commonly known as:  GLUCOPHAGE Take 1 tablet (850 mg total) by mouth 2 (two) times daily with a meal. ( Metformina 850 mg ) - pt gets meds from [place]   NIFEdipine 30 MG 24 hr tablet Commonly known as:  ADALAT CC Take 30 mg by mouth daily. ( Nifedipino ) gets from [place]   triamcinolone cream 0.1 % Commonly known as:  KENALOG Apply 1 application topically 3 (three) times daily.            Durable Medical Equipment  (From admission, onward)         Start  Ordered   11/07/18 1516  For home use only DME oxygen  Once    Question Answer Comment  Length of Need 6 Months   Mode or (Route) Nasal cannula   Liters per Minute 2   Frequency Continuous (stationary and portable oxygen unit needed)   Oxygen conserving device Yes   Oxygen delivery system Gas      11/07/18 1515          No Known Allergies  Consultations:     Procedures/Studies: Dg Chest Port 1 View  Result Date: 11/05/2018 CLINICAL DATA:  Cough and fever EXAM: PORTABLE CHEST 1 VIEW COMPARISON:  05/03/2016 FINDINGS: There are  diffuse hazy bilateral airspace opacities. No pneumothorax. No large pleural effusion. The cardiac silhouette is enlarged. Old healed right-sided rib fractures are noted. There is no acute osseous abnormality. IMPRESSION: Multifocal airspace opacities highly concerning for multifocal pneumonia (viral or bacterial). Borderline enlarged heart. Electronically Signed   By: Katherine Mantle M.D.   On: 11/05/2018 01:09       Subjective: Patient is feeling better today. No shortness of breath on exertion  Discharge Exam: Vitals:   11/07/18 0600 11/07/18 0700 11/07/18 0800 11/07/18 1153  BP: (!) 128/55  (!) 145/65 137/74  Pulse: 63 64 61 82  Resp: Temp:   97.8 F (36.6 C) 98.1 F (36.7 C)  TempSrc:   Oral Oral  SpO2: 94% 96% 91% 94%  Weight:      Height:        General: Pt is alert, awake, not in acute distress Cardiovascular: RRR, S1/S2 +, no rubs, no gallops Respiratory: CTA bilaterally, no wheezing, no rhonchi Abdominal: Soft, NT, ND, bowel sounds + Extremities: no edema, no cyanosis    The results of significant diagnostics from this hospitalization (including imaging, microbiology, ancillary and laboratory) are listed below for reference.     Microbiology: Recent Results (from the past 240 hour(s))  SARS Coronavirus 2 [facility] order, Performed in [facility] hospital lab)     Status: Abnormal   Collection Time: 11/05/18 12:51 AM  Result Value Ref Range Status   SARS Coronavirus 2 POSITIVE (A) NEGATIVE Final    Comment: RESULT CALLED TO, READ BACK BY AND VERIFIED WITH: ALissa Merlin 0221 11/05/2018 T. TYSOR (NOTE) If result is NEGATIVE SARS-CoV-2 target nucleic acids are NOT DETECTED. The SARS-CoV-2 RNA is generally detectable in upper and lower  respiratory specimens during the acute phase of infection. The lowest  concentration of SARS-CoV-2 viral copies this assay can detect is 250  copies / mL. A negative result does not preclude SARS-CoV-2 infection   and should not be used as the sole basis for treatment or other  patient management decisions.  A negative result may occur with  improper specimen collection / handling, submission of specimen other  than nasopharyngeal swab, presence of viral mutation(s) within the  areas targeted by this assay, and inadequate number of viral copies  (<250 copies / mL). A negative result must be combined with clinical  observations, patient history, and epidemiological information. If result is POSITIVE SARS-CoV-2 target nucleic acids are DETECTED.  The SARS-CoV-2 RNA is generally detectable in upper and lower  respiratory specimens during the acute phase of infection.  Positive  results are indicative of active infection with SARS-CoV-2.  Clinical  correlation with patient history and other diagnostic information is  necessary to determine patient infection status.  Positive results do  not rule out bacterial infection or co-infection with  other viruses. If result is PRESUMPTIVE POSTIVE SARS-CoV-2 nucleic acids MAY BE PRESENT.   A presumptive positive result was obtained on the submitted specimen  and confirmed on repeat testing.  While 2019 novel coronavirus  (SARS-CoV-2) nucleic acids may be present in the submitted sample  additional confirmatory testing may be necessary for epidemiological  and / or clinical management purposes  to differentiate between  SARS-CoV-2 and other Sarbecovirus currently known to infect humans.  If clinically indicated additional testing with an alternate test  methodology [phone]([id number])  is advised. The SARS-CoV-2 RNA is generally  detectable in upper and lower respiratory specimens during the acute  phase of infection. The expected result is Negative. Fact Sheet for Patients:  BoilerBrush.com.cyhttps://www.fda.gov/media/136312/download Fact Sheet for Healthcare Providers: https://pope.com/https://www.fda.gov/media/136313/download This test is not yet approved or cleared by the [place] FDA  and has been authorized for detection and/or diagnosis of SARS-CoV-2 by FDA under an Emergency Use Authorization (EUA).  This EUA will remain in effect (meaning this test can be used) for the duration of the COVID-19 declaration under Section 564(b)(1) of the Act, 21 U.S.C. section 360bbb-3(b)(1), unless the authorization is terminated or revoked sooner. Performed at [facility], [address] [place], [place] [address]   Culture, blood (Routine X 2) w Reflex to ID Panel     Status: None (Preliminary result)   Collection Time: 11/05/18  4:20 AM  Result Value Ref Range Status   Specimen Description BLOOD LEFT HAND  Final   Special Requests   Final    BOTTLES DRAWN AEROBIC AND ANAEROBIC Blood Culture adequate volume   Culture   Final    NO GROWTH 2 DAYS Performed at [facility], [address] [place], [place] [address]    Report Status PENDING  Incomplete  Culture, blood (Routine X 2) w Reflex to ID Panel     Status: None (Preliminary result)   Collection Time: 11/05/18  4:22 AM  Result Value Ref Range Status   Specimen Description BLOOD RIGHT ARM  Final   Special Requests   Final    BOTTLES DRAWN AEROBIC AND ANAEROBIC Blood Culture adequate volume   Culture   Final    NO GROWTH 2 DAYS Performed at [facility], [address] [place], [place] [address]    Report Status PENDING  Incomplete     Labs: BNP (last 3 results) No results for input(s): BNP in the last 8760 hours. Basic Metabolic Panel: Recent Labs  Lab 11/05/18 0037 11/06/18 0323 11/07/18 0438  NA 128* 139 139  K 5.3* 4.5 4.2  CL 94* 101 104  CO2 24 28 28   GLUCOSE 292* 260* 35*  BUN 15 16 28*  CREATININE 0.60 0.52 0.47  CALCIUM 8.6* 8.9 9.1   Liver Function Tests: Recent Labs  Lab 11/05/18 0037 11/06/18 0323 11/07/18 0438  AST 23 22 20   ALT 13 14 12   ALKPHOS 97 94 95  BILITOT 0.6 0.1* 0.2*  PROT 7.4 7.5 7.2  ALBUMIN 3.1* 3.1* 3.0*   No results for input(s): LIPASE,  AMYLASE in the last 168 hours. No results for input(s): AMMONIA in the last 168 hours. CBC: Recent Labs  Lab 11/05/18 0037 11/06/18 0323 11/07/18 0438  WBC 6.4 3.8* 9.3  NEUTROABS 5.5 3.1 8.0*  HGB 12.1 12.8 12.4  HCT 37.2 40.3 38.2  MCV 83.4 86.1 84.1  PLT 250 295 350   Cardiac Enzymes: No results for input(s): CKTOTAL, CKMB, CKMBINDEX, TROPONINI in the  last 168 hours. BNP: Invalid input(s): POCBNP CBG: Recent Labs  Lab 11/06/18 1217 11/06/18 2039 11/06/18 2316 11/07/18 0738 11/07/18 1154  GLUCAP 393* 516* 428* 145* 247*   D-Dimer Recent Labs    11/06/18 0323 11/07/18 0438  DDIMER 0.80* 0.71*   Hgb A1c No results for input(s): HGBA1C in the last 72 hours. Lipid Profile No results for input(s): CHOL, HDL, LDLCALC, TRIG, CHOLHDL, LDLDIRECT in the last 72 hours. Thyroid function studies No results for input(s): TSH, T4TOTAL, T3FREE, THYROIDAB in the last 72 hours.  Invalid input(s): FREET3 Anemia work up [employer]    11/06/18 0255  FERRITIN 303   Urinalysis No results found for: COLORURINE, APPEARANCEUR, LABSPEC, PHURINE, GLUCOSEU, HGBUR, BILIRUBINUR, KETONESUR, PROTEINUR, UROBILINOGEN, NITRITE, LEUKOCYTESUR Sepsis Labs Invalid input(s): PROCALCITONIN,  WBC,  LACTICIDVEN Microbiology Recent Results (from the past 240 hour(s))  SARS Coronavirus 2 [facility] order, Performed in [facility] hospital lab)     Status: Abnormal   Collection Time: 11/05/18 12:51 AM  Result Value Ref Range Status   SARS Coronavirus 2 POSITIVE (A) NEGATIVE Final    Comment: RESULT CALLED TO, READ BACK BY AND VERIFIED WITH: ALissa Merlin 0221 11/05/2018 T. TYSOR (NOTE) If result is NEGATIVE SARS-CoV-2 target nucleic acids are NOT DETECTED. The SARS-CoV-2 RNA is generally detectable in upper and lower  respiratory specimens during the acute phase of infection. The lowest  concentration of SARS-CoV-2 viral copies this assay can detect is 250  copies / mL. A negative result does  not preclude SARS-CoV-2 infection  and should not be used as the sole basis for treatment or other  patient management decisions.  A negative result may occur with  improper specimen collection / handling, submission of specimen other  than nasopharyngeal swab, presence of viral mutation(s) within the  areas targeted by this assay, and inadequate number of viral copies  (<250 copies / mL). A negative result must be combined with clinical  observations, patient history, and epidemiological information. If result is POSITIVE SARS-CoV-2 target nucleic acids are DETECTED.  The SARS-CoV-2 RNA is generally detectable in upper and lower  respiratory specimens during the acute phase of infection.  Positive  results are indicative of active infection with SARS-CoV-2.  Clinical  correlation with patient history and other diagnostic information is  necessary to determine patient infection status.  Positive results do  not rule out bacterial infection or co-infection with other viruses. If result is PRESUMPTIVE POSTIVE SARS-CoV-2 nucleic acids MAY BE PRESENT.   A presumptive positive result was obtained on the submitted specimen  and confirmed on repeat testing.  While 2019 novel coronavirus  (SARS-CoV-2) nucleic acids may be present in the submitted sample  additional confirmatory testing may be necessary for epidemiological  and / or clinical management purposes  to differentiate between  SARS-CoV-2 and other Sarbecovirus currently known to infect humans.  If clinically indicated additional testing with an alternate test  methodology [phone])  is advised. The SARS-CoV-2 RNA is generally  detectable in upper and lower respiratory specimens during the acute  phase of infection. The expected result is Negative. Fact Sheet for Patients:  BoilerBrush.com.cy Fact Sheet for Healthcare Providers: https://pope.com/ This test is not yet approved or  cleared by the [place] FDA and has been authorized for detection and/or diagnosis of SARS-CoV-2 by FDA under an Emergency Use Authorization (EUA).  This EUA will remain in effect (meaning this test can be used) for the duration of the COVID-19 declaration under Section 564(b)(1) of the Act,  21 U.S.C. section 360bbb-3(b)(1), unless the authorization is terminated or revoked sooner. Performed at [facility], [address] [place], [place] [address]   Culture, blood (Routine X 2) w Reflex to ID Panel     Status: None (Preliminary result)   Collection Time: 11/05/18  4:20 AM  Result Value Ref Range Status   Specimen Description BLOOD LEFT HAND  Final   Special Requests   Final    BOTTLES DRAWN AEROBIC AND ANAEROBIC Blood Culture adequate volume   Culture   Final    NO GROWTH 2 DAYS Performed at [facility], [address] [place], [place] [address]    Report Status PENDING  Incomplete  Culture, blood (Routine X 2) w Reflex to ID Panel     Status: None (Preliminary result)   Collection Time: 11/05/18  4:22 AM  Result Value Ref Range Status   Specimen Description BLOOD RIGHT ARM  Final   Special Requests   Final    BOTTLES DRAWN AEROBIC AND ANAEROBIC Blood Culture adequate volume   Culture   Final    NO GROWTH 2 DAYS Performed at [facility], [address] [place], [place] [address]    Report Status PENDING  Incomplete     Time coordinating discharge:  SIGNED:   Erick Blinks, MD  [facility] 11/07/2018, 3:32 PM   If 7PM-7AM, please contact night-coverage [email or web address]

## 2018-11-07 NOTE — Progress Notes (Signed)
CardioVascular Research Department and AHF Team  ReDS Research Project   Patient #: [id number]  ReDS Measurement  Right: 48 %  Left: 55 %

## 2018-11-08 LAB — INTERLEUKIN-6, PLASMA: Interleukin-6, Plasma: 54.4 pg/mL — ABNORMAL HIGH (ref 0.0–12.2)

## 2018-11-08 NOTE — Progress Notes (Signed)
Patient ask to use the bathroom and asking for her cane. Interpreter used and patient was satisfied with conversation. Patient ready to go home. Ambulated in room, tolerated well. Patient d/c transported by [facility]. Patient contacted grandson. Paperwork sent with patient.

## 2018-11-10 LAB — CULTURE, BLOOD (ROUTINE X 2)
Culture: NO GROWTH
Culture: NO GROWTH
Special Requests: ADEQUATE
Special Requests: ADEQUATE

## 2018-11-14 ENCOUNTER — Ambulatory Visit: Payer: Self-pay | Attending: Family Medicine | Admitting: Family Medicine

## 2018-11-14 ENCOUNTER — Other Ambulatory Visit: Payer: Self-pay

## 2018-11-14 DIAGNOSIS — J1282 Pneumonia due to coronavirus disease 2019: Secondary | ICD-10-CM

## 2018-11-14 DIAGNOSIS — U071 COVID-19: Secondary | ICD-10-CM

## 2018-11-17 ENCOUNTER — Telehealth: Payer: Self-pay | Admitting: Family Medicine

## 2018-11-17 NOTE — Telephone Encounter (Signed)
Patient needs order for [employer] to discontinue O2.

## 2018-11-17 NOTE — Telephone Encounter (Signed)
Patients grand daughter called to inform that her grandmother is doing better and [facility] is needing a discontinuing ordering in order to pick up the oxygen tank. Please follow up.

## 2018-11-20 MED ORDER — MISC. DEVICES MISC: 0 refills | Status: AC

## 2018-11-20 NOTE — Telephone Encounter (Signed)
Done

## 2018-11-22 NOTE — Telephone Encounter (Signed)
Paperwork has been faxed over.

## 2018-12-07 NOTE — Progress Notes (Signed)
Opened in error
